# Patient Record
Sex: Female | Born: 1965 | Race: Black or African American | Hispanic: No
Health system: Southern US, Community
[De-identification: ages and names within clinical notes are randomized; demographics above are authoritative.]

## PROBLEM LIST (undated history)

## (undated) DIAGNOSIS — E119 Type 2 diabetes mellitus without complications: Secondary | ICD-10-CM

## (undated) DIAGNOSIS — D219 Benign neoplasm of connective and other soft tissue, unspecified: Secondary | ICD-10-CM

## (undated) DIAGNOSIS — D649 Anemia, unspecified: Secondary | ICD-10-CM

## (undated) HISTORY — PX: TUBAL LIGATION: SHX77

---

## 2006-02-03 ENCOUNTER — Emergency Department (HOSPITAL_COMMUNITY): Admission: EM | Admit: 2006-02-03 | Discharge: 2006-02-03 | Payer: Self-pay | Admitting: Emergency Medicine

## 2006-04-10 ENCOUNTER — Ambulatory Visit: Payer: Self-pay | Admitting: Internal Medicine

## 2006-04-10 DIAGNOSIS — E119 Type 2 diabetes mellitus without complications: Secondary | ICD-10-CM

## 2006-04-10 DIAGNOSIS — E785 Hyperlipidemia, unspecified: Secondary | ICD-10-CM | POA: Insufficient documentation

## 2006-04-10 LAB — CONVERTED CEMR LAB: LDL Cholesterol: 105 mg/dL

## 2006-05-13 ENCOUNTER — Encounter (INDEPENDENT_AMBULATORY_CARE_PROVIDER_SITE_OTHER): Payer: Self-pay | Admitting: Internal Medicine

## 2006-05-13 ENCOUNTER — Ambulatory Visit: Payer: Self-pay | Admitting: Internal Medicine

## 2006-05-28 ENCOUNTER — Ambulatory Visit: Payer: Self-pay | Admitting: *Deleted

## 2006-08-14 ENCOUNTER — Ambulatory Visit: Payer: Self-pay | Admitting: Internal Medicine

## 2006-08-14 LAB — CONVERTED CEMR LAB: Cholesterol: 143 mg/dL

## 2006-10-14 ENCOUNTER — Ambulatory Visit: Payer: Self-pay | Admitting: Internal Medicine

## 2006-11-18 ENCOUNTER — Ambulatory Visit: Payer: Self-pay | Admitting: Internal Medicine

## 2007-02-09 ENCOUNTER — Telehealth (INDEPENDENT_AMBULATORY_CARE_PROVIDER_SITE_OTHER): Payer: Self-pay | Admitting: Internal Medicine

## 2007-02-10 ENCOUNTER — Encounter (INDEPENDENT_AMBULATORY_CARE_PROVIDER_SITE_OTHER): Payer: Self-pay | Admitting: Internal Medicine

## 2007-02-11 ENCOUNTER — Telehealth (INDEPENDENT_AMBULATORY_CARE_PROVIDER_SITE_OTHER): Payer: Self-pay | Admitting: Internal Medicine

## 2007-02-15 DIAGNOSIS — N920 Excessive and frequent menstruation with regular cycle: Secondary | ICD-10-CM

## 2007-02-15 DIAGNOSIS — G5600 Carpal tunnel syndrome, unspecified upper limb: Secondary | ICD-10-CM

## 2007-02-15 DIAGNOSIS — D259 Leiomyoma of uterus, unspecified: Secondary | ICD-10-CM | POA: Insufficient documentation

## 2007-02-15 DIAGNOSIS — R609 Edema, unspecified: Secondary | ICD-10-CM

## 2007-02-18 ENCOUNTER — Encounter (INDEPENDENT_AMBULATORY_CARE_PROVIDER_SITE_OTHER): Payer: Self-pay | Admitting: *Deleted

## 2007-02-19 ENCOUNTER — Ambulatory Visit: Payer: Self-pay | Admitting: Internal Medicine

## 2007-02-19 DIAGNOSIS — N946 Dysmenorrhea, unspecified: Secondary | ICD-10-CM

## 2007-02-23 ENCOUNTER — Encounter (INDEPENDENT_AMBULATORY_CARE_PROVIDER_SITE_OTHER): Payer: Self-pay | Admitting: Nurse Practitioner

## 2007-08-25 ENCOUNTER — Ambulatory Visit: Payer: Self-pay | Admitting: Internal Medicine

## 2007-08-25 DIAGNOSIS — R05 Cough: Secondary | ICD-10-CM

## 2007-08-25 LAB — CONVERTED CEMR LAB: Hgb A1c MFr Bld: 5.8 %

## 2007-08-26 ENCOUNTER — Telehealth (INDEPENDENT_AMBULATORY_CARE_PROVIDER_SITE_OTHER): Payer: Self-pay | Admitting: Internal Medicine

## 2007-08-26 DIAGNOSIS — K219 Gastro-esophageal reflux disease without esophagitis: Secondary | ICD-10-CM

## 2008-02-10 ENCOUNTER — Telehealth (INDEPENDENT_AMBULATORY_CARE_PROVIDER_SITE_OTHER): Payer: Self-pay | Admitting: Internal Medicine

## 2008-03-08 ENCOUNTER — Encounter (INDEPENDENT_AMBULATORY_CARE_PROVIDER_SITE_OTHER): Payer: Self-pay | Admitting: Internal Medicine

## 2008-03-08 ENCOUNTER — Ambulatory Visit: Payer: Self-pay | Admitting: Internal Medicine

## 2008-03-08 DIAGNOSIS — R82998 Other abnormal findings in urine: Secondary | ICD-10-CM | POA: Insufficient documentation

## 2008-03-08 LAB — CONVERTED CEMR LAB
Albumin: 4.1 g/dL (ref 3.5–5.2)
BUN: 6 mg/dL (ref 6–23)
Basophils Absolute: 0 10*3/uL (ref 0.0–0.1)
Basophils Relative: 0 % (ref 0–1)
Bilirubin Urine: NEGATIVE
Blood in Urine, dipstick: NEGATIVE
CO2: 22 meq/L (ref 19–32)
Calcium: 8.7 mg/dL (ref 8.4–10.5)
Chlamydia, DNA Probe: NEGATIVE
Chloride: 105 meq/L (ref 96–112)
GC Probe Amp, Genital: NEGATIVE
Glucose, Bld: 83 mg/dL (ref 70–99)
Hemoglobin: 12 g/dL (ref 12.0–15.0)
Hgb A1c MFr Bld: 6.1 %
KOH Prep: NEGATIVE
Ketones, urine, test strip: NEGATIVE
Lymphocytes Relative: 39 % (ref 12–46)
Lymphs Abs: 2.9 10*3/uL (ref 0.7–4.0)
MCV: 89.6 fL (ref 78.0–100.0)
Monocytes Absolute: 0.6 10*3/uL (ref 0.1–1.0)
Monocytes Relative: 8 % (ref 3–12)
Nitrite: POSITIVE
Platelets: 277 10*3/uL (ref 150–400)
RBC: 4.15 M/uL (ref 3.87–5.11)
RDW: 15.3 % (ref 11.5–15.5)
Specific Gravity, Urine: 1.015
VLDL: 12 mg/dL (ref 0–40)
WBC: 7.3 10*3/uL (ref 4.0–10.5)
Whiff Test: POSITIVE

## 2008-03-09 ENCOUNTER — Encounter (INDEPENDENT_AMBULATORY_CARE_PROVIDER_SITE_OTHER): Payer: Self-pay | Admitting: Internal Medicine

## 2008-03-15 ENCOUNTER — Ambulatory Visit (HOSPITAL_COMMUNITY): Admission: RE | Admit: 2008-03-15 | Discharge: 2008-03-15 | Payer: Self-pay | Admitting: Internal Medicine

## 2008-03-15 ENCOUNTER — Encounter (INDEPENDENT_AMBULATORY_CARE_PROVIDER_SITE_OTHER): Payer: Self-pay | Admitting: Internal Medicine

## 2008-03-28 ENCOUNTER — Encounter: Admission: RE | Admit: 2008-03-28 | Discharge: 2008-03-28 | Payer: Self-pay | Admitting: Internal Medicine

## 2008-03-28 ENCOUNTER — Encounter (INDEPENDENT_AMBULATORY_CARE_PROVIDER_SITE_OTHER): Payer: Self-pay | Admitting: Internal Medicine

## 2008-05-11 ENCOUNTER — Ambulatory Visit: Payer: Self-pay | Admitting: Obstetrics and Gynecology

## 2008-05-12 ENCOUNTER — Encounter: Payer: Self-pay | Admitting: Family

## 2008-05-12 LAB — CONVERTED CEMR LAB
Hemoglobin: 12.2 g/dL (ref 12.0–15.0)
MCHC: 31.8 g/dL (ref 30.0–36.0)
Platelets: 331 10*3/uL (ref 150–400)
RDW: 14.8 % (ref 11.5–15.5)
TSH: 2.242 microintl units/mL (ref 0.350–4.50)

## 2008-05-19 ENCOUNTER — Ambulatory Visit (HOSPITAL_COMMUNITY): Admission: RE | Admit: 2008-05-19 | Discharge: 2008-05-19 | Payer: Self-pay | Admitting: Obstetrics & Gynecology

## 2008-06-01 ENCOUNTER — Ambulatory Visit (HOSPITAL_COMMUNITY): Admission: RE | Admit: 2008-06-01 | Discharge: 2008-06-01 | Payer: Self-pay | Admitting: Obstetrics and Gynecology

## 2008-09-21 ENCOUNTER — Encounter (INDEPENDENT_AMBULATORY_CARE_PROVIDER_SITE_OTHER): Payer: Self-pay | Admitting: Internal Medicine

## 2008-09-22 ENCOUNTER — Telehealth (INDEPENDENT_AMBULATORY_CARE_PROVIDER_SITE_OTHER): Payer: Self-pay | Admitting: *Deleted

## 2009-01-04 ENCOUNTER — Telehealth (INDEPENDENT_AMBULATORY_CARE_PROVIDER_SITE_OTHER): Payer: Self-pay | Admitting: Internal Medicine

## 2009-10-02 ENCOUNTER — Encounter (INDEPENDENT_AMBULATORY_CARE_PROVIDER_SITE_OTHER): Payer: Self-pay | Admitting: Internal Medicine

## 2010-03-12 ENCOUNTER — Encounter: Payer: Self-pay | Admitting: Physician Assistant

## 2010-03-12 ENCOUNTER — Ambulatory Visit: Payer: Self-pay | Admitting: Internal Medicine

## 2010-03-12 DIAGNOSIS — J069 Acute upper respiratory infection, unspecified: Secondary | ICD-10-CM | POA: Insufficient documentation

## 2010-03-12 LAB — CONVERTED CEMR LAB
BUN: 8 mg/dL (ref 6–23)
Blood Glucose, Fingerstick: 175
CO2: 25 meq/L (ref 19–32)
Chloride: 106 meq/L (ref 96–112)
Glucose, Bld: 151 mg/dL — ABNORMAL HIGH (ref 70–99)
Hgb A1c MFr Bld: 6 % — ABNORMAL HIGH (ref <5.7)
Sodium: 138 meq/L (ref 135–145)

## 2010-03-30 ENCOUNTER — Telehealth (INDEPENDENT_AMBULATORY_CARE_PROVIDER_SITE_OTHER): Payer: Self-pay | Admitting: Internal Medicine

## 2010-04-07 ENCOUNTER — Encounter (INDEPENDENT_AMBULATORY_CARE_PROVIDER_SITE_OTHER): Payer: Self-pay | Admitting: Internal Medicine

## 2010-05-31 ENCOUNTER — Ambulatory Visit: Payer: Self-pay | Admitting: Internal Medicine

## 2010-07-03 NOTE — Assessment & Plan Note (Signed)
Summary: URI; DM   Vital Signs:  Patient profile:   45 year old female Height:      69 inches Weight:      252 pounds BMI:     37.35 Temp:     97.8 degrees F oral Pulse rate:   76 / minute Pulse rhythm:   regular Resp:     18 per minute BP sitting:   128 / 82  (left arm) Cuff size:   large  Vitals Entered By: [place] Shannon (March 12, 2010 10:17 AM) CC: pt says she has been sick since  [place].. pt says she needs a note for work since she missed Sat and Sunday and she don't think she can make it today... Is Patient Diabetic? Yes Pain Assessment Patient in pain? no      CBG Result 175  Does patient need assistance? Functional Status Self care Ambulation Normal   Primary Care Provider:  Julieanne Manson MD  CC:  pt says she has been sick since  Wed.. pt says she needs a note for work since she missed Sat and Sunday and she don't think she can make it today....  History of Present Illness: Here for URI. Not seen since 03/2008.  Out of meds for DM for 6 mos or so.  She lost eligiblity.  FInally got orange card back.  Had a hard time making appt . . they thought she was a new patient.  DM2:  Since out of meds, she gets "shakes" when sugar is too low.  Feels "drunk" when sugar too high.  Does not check sugars at home.  Did not eat today.  Notes polyuria.  Notes some blurry vision.  URI:  Notes symptoms for 6 days.  + cough . . . thick yellow/white; notes a lot of sputum.  + headache . Marland Kitchen .points to frontal and parietal areas bilat and post neck (feels like pressure).  + scratchy throat.  No otalgia.  Chest hurts when she coughs.  No dyspnea.  + nasal congestion.  + fever . . . did not checks.  + chills last week.  Feels better today.  States today is the best she has felt since it began.  Needs doctor's note for work.  Works 2 jobs.  Current Medications (verified): 1)  Byetta 5 Mcg Pen 5 Mcg/0.56ml  Soln (Exenatide) .... 5 Micrograms Subcutaneously Two Times A Day With Meals 2)   Metformin Hcl 500 Mg  Tabs (Metformin Hcl) .Marland Kitchen.. 1 Tab Po Two Times A Day With Meals 3)  Furosemide 20 Mg  Tabs (Furosemide) .... 1/2 To 1 Tab By Mouth Q Am 4)  Pen Needles 31g X 8 Mm  Misc (Insulin Pen Needle) .... Use Twice Daily With Byetta 5)  Glucometer Elite Classic   Kit (Blood Glucose Monitoring Suppl) .... Once Daily Testing 6)  Glucometer Elite Test   Strp (Glucose Blood) .... Once Daily Testing 7)  Nexium 40 Mg  Cpdr (Esomeprazole Magnesium) .Marland Kitchen.. 1 Tablet By Mouth Daily For Stomach  Allergies (verified): No Known Drug Allergies  Social History: Separated Lives at home with 73 yo son, 76 yo son, 59 yo son, 30 yo son, 8 yo daughter and 21 yo nephew(son of sister who died)--[place]. 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  Physical Exam  General:  alert, well-developed, and well-nourished.   Head:  normocephalic and atraumatic.   Eyes:  pupils equal,  pupils round, and pupils reactive to light.   Ears:  R ear normal and L ear normal.   Nose:  no nasal discharge.   Mouth:  pharynx pink and moist and no exudates.   Neck:  supple and no cervical lymphadenopathy.   Lungs:  good air movement exp wheezes at bases bilat no rales  Heart:  normal rate and regular rhythm.   Neurologic:  alert & oriented X3 and cranial nerves II-XII intact.   Skin:  turgor normal.   Psych:  normally interactive.     Impression & Recommendations:  Problem # 1:  UPPER RESPIRATORY INFECTION (ICD-465.9)  prob viral has dev some bronchitis likely 2/2 smoking symptomatic tx for now if no improvement or getting worse, may need antibx therapy note given will give tussinex to use at bedtime to help with cough mucinex otc tessalon perles proventil as needed fluids, rest, tylenol  Her updated medication list for this problem includes:    Tessalon Perles 100 Mg Caps (Benzonatate) .Marland Kitchen... Take 1 capsule by mouth three  times a day as needed for cough    Tussionex Pennkinetic Er 10-8 Mg/68ml Lqcr (Hydrocod polst-chlorphen polst) .Marland Kitchen... 1 tsp at bedtime as needed for cough  Problem # 2:  DEPENDENT EDEMA, LEGS (ICD-782.3)  needs furosemide refilled  Her updated medication list for this problem includes:    Furosemide 20 Mg Tabs (Furosemide) .Marland Kitchen... 1/2 to 1 tab by mouth q am  Orders: T-Basic Metabolic Panel [id number])  Problem # 3:  DIABETES MELLITUS, TYPE II, CONTROLLED (ICD-250.00)  check lab refill meds f/u with Dr. Delrae Alfred  Her updated medication list for this problem includes:    Byetta 5 Mcg Pen 5 Mcg/0.51ml Soln (Exenatide) .Marland KitchenMarland KitchenMarland KitchenMarland Kitchen 5 micrograms subcutaneously two times a day with meals    Metformin Hcl 500 Mg Tabs (Metformin hcl) .Marland Kitchen... 1 tab po two times a day with meals  Orders: Capillary Blood Glucose/CBG (82948) T- Hemoglobin A1C (64332-95188) T-Basic Metabolic Panel ([address]60-63016)  Complete Medication List: 1)  Byetta 5 Mcg Pen 5 Mcg/0.28ml Soln (Exenatide) .... 5 micrograms subcutaneously two times a day with meals 2)  Metformin Hcl 500 Mg Tabs (Metformin hcl) .Marland Kitchen.. 1 tab po two times a day with meals 3)  Furosemide 20 Mg Tabs (Furosemide) .... 1/2 to 1 tab by mouth q am 4)  Pen Needles 31g X 8 Mm Misc (Insulin pen needle) .... Use twice daily with byetta 5)  Glucometer Elite Classic Kit (Blood glucose monitoring suppl) .... Once daily testing 6)  Glucometer Elite Test Strp (Glucose blood) .... Once daily testing 7)  Tessalon Perles 100 Mg Caps (Benzonatate) .... Take 1 capsule by mouth three times a day as needed for cough 8)  Proventil Hfa 108 (90 Base) Mcg/act Aers (Albuterol sulfate) .Marland Kitchen.. 1-2 puffs every 4-6 hours as needed 9)  Tussionex Pennkinetic Er 10-8 Mg/22ml Lqcr (Hydrocod polst-chlorphen polst) .Marland Kitchen.. 1 tsp at bedtime as needed for cough  Patient Instructions: 1)  Get Mucinex DM over the counter and take 600 mg by mouth two times a day for 10 days. 2)  Use Tessalon Perles as  needed for cough. 3)  Use the Tussinex at bedtime as needed for cough.  It has hydrocodone in it and will make you drowsy. 4)  Use the Proventil every 4-6 hours around the clock for 2-3 days.  Then, use every 4-6 hours as needed. 5)  Drink plenty of fluids.  Get plenty of rest. 6)  Take tylenol as needed for pain or  headaches or fever. 7)  Schedule follow up if no better or feeling worse. 8)  I have faxed your prescriptions to the [facility]. pharmacy. 9)  Schedule a CPP with Dr. Delrae Alfred in 4 weeks. Prescriptions: TUSSIONEX PENNKINETIC ER 10-8 MG/5ML LQCR (HYDROCOD POLST-CHLORPHEN POLST) 1 tsp at bedtime as needed for cough  #60 mL x 0   Entered and Authorized by:   Tereso Newcomer PA-C   Signed by:   Tereso Newcomer PA-C on 03/12/2010   Method used:   Print then Give to Patient   RxID:   [id number] GLUCOMETER ELITE TEST   STRP (GLUCOSE BLOOD) once daily testing  #100 x 4   Entered and Authorized by:   Tereso Newcomer PA-C   Signed by:   Tereso Newcomer PA-C on 03/12/2010   Method used:   Faxed to ...       [facility] - [facility] (retail)       [address] [place], [place]  [address]       Ph: [phone]       Fax: [phone]   RxID:   [id number] PEN NEEDLES 31G X 8 MM  MISC (INSULIN PEN NEEDLE) use twice daily with byetta  #60 x 3   Entered and Authorized by:   Tereso Newcomer PA-C   Signed by:   Tereso Newcomer PA-C on 03/12/2010   Method used:   Faxed to ...       [facility] - Pharmac (retail)       [address] [place], [place]  [address]       Ph: [phone]       Fax: [phone]   RxID:   [id number] FUROSEMIDE 20 MG  TABS (FUROSEMIDE) 1/2 to 1 tab by mouth q am  #30 x 3   Entered and Authorized by:   Tereso Newcomer PA-C   Signed by:   Tereso Newcomer PA-C on 03/12/2010   Method used:   Faxed to ...       [facility] - Pharmac (retail)       [address] [place], [place]  [address]       Ph: [phone]       Fax: [phone]   RxID:   [id number] METFORMIN HCL 500 MG  TABS (METFORMIN HCL) 1 tab po two times a day with meals  #60 x 3   Entered and Authorized by:   Tereso Newcomer PA-C   Signed by:   Tereso Newcomer PA-C on 03/12/2010   Method used:   Faxed to ...       [facility] - Pharmac (retail)       [address] [place], [place]  [address]       Ph: [phone]       Fax: [phone]   RxID:   [id number] BYETTA 5 MCG PEN 5 MCG/0.02ML  SOLN (EXENATIDE) 5 micrograms subcutaneously two times a day with meals  #1 mo supply x 3   Entered and Authorized by:   Tereso Newcomer PA-C   Signed by:   Tereso Newcomer PA-C on 03/12/2010   Method used:   Faxed to ...       [facility] - Pharmac (retail)       [address]  [place], [place]  [address]       Ph: [phone]       Fax: [phone]   RxID:   [id number] PROVENTIL HFA 108 (90 BASE) MCG/ACT AERS (ALBUTEROL SULFATE) 1-2 puffs every 4-6 hours as needed  #1 x 0   Entered and Authorized by:   Tereso Newcomer PA-C   Signed by:   Tereso Newcomer PA-C on 03/12/2010   Method used:   Faxed to ...       [facility] - Pharmac (retail)       [address] [place], [place]  [address]       Ph: [phone]       Fax: [phone]   RxID:   [id number] TESSALON PERLES 100 MG CAPS (BENZONATATE) Take 1 capsule by mouth three times a day as needed for cough  #30 x 0   Entered and Authorized by:   Tereso Newcomer PA-C   Signed by:   Tereso Newcomer PA-C on 03/12/2010   Method used:   Faxed to ...       [facility] - Pharmac (retail)       [address] [place], [place]  [address]       Ph: [phone]       Fax: [phone]   RxID:   [id number]

## 2010-07-03 NOTE — Progress Notes (Signed)
Summary: NEEDS SAMPLES OF BEYETTA  Phone Note Call from Patient Call back at [facility] Phone [phone]   Summary of Call: Alexandra Patrick PT. MS HOPKISN CALLED BECAUSE [facility] TOLD HER TO SEE IF WE HAD SAMPLES OF THE BEYETTA PEN, BECAUSE ITS GONNA TAKE UP TO 7 WEEKS FOR HER PEN TO COME FROM THE PHARMACUTICAL COMP.  Initial call taken by: Leodis Rains,  March 28, 2010 3:00PM  Follow-up for Phone Call        I SPOKE W/TERESA, AND SHE SAYS WE HAVE 2 SAMPLES AND SHE SAYS THAT SHE WOULD EITHER COME BY ON TOMORROW 10/27 OR ON 10/28 TO PICK THEM UP. Follow-up by: Leodis Rains,  March 30, 2010 2:07 PM  Additional Follow-up for Phone Call Additional follow up Details #1::        2 samples of Byetta 5 micrograms pens given to pt. Dutch Quint RN  March 30, 2010 2:13 PM

## 2010-07-03 NOTE — Letter (Signed)
Summary: [facility]//faxed  [facility]//faxed   Imported By: Arta Bruce 10/02/2009 12:01:47  _____________________________________________________________________  External Attachment:    Type:   Image     Comment:   External Document

## 2010-07-03 NOTE — Progress Notes (Signed)
Summary: NEEDS MEDS REFILLS  Phone Note Call from Patient   Caller: Patient Summary of Call: Alexandra Patrick PT. NEEDS A REFILL ON HER BEYETA MEDICAITON AND HASN'T HAD IT SINCE LAST WEDNEDSAY, AND NEEDS IT CALLED INTO [facility] Initial call taken by: Leodis Rains,  February 11, 2007 10:20 AM  Follow-up for Phone Call        Phone Call Completed Follow-up by: Vesta Mixer CMA,  February 11, 2007 12:13 PM

## 2010-07-03 NOTE — Letter (Signed)
Summary: Out of Work  [facility]  [address] [place], [place] [address]   Phone: [phone]  Fax: [phone]    March 12, 2010   Employee:  TATYANNA CRONK    To Whom It May Concern:   For Medical reasons, please excuse the above named employee from work for the following dates:  Start:   Saturday, March 10, 2009  End:   Tuesday, March 13, 2009  If you need additional information, please feel free to contact our office.         Sincerely,    Tereso Newcomer PA-C

## 2010-07-03 NOTE — Letter (Signed)
Summary: *HSN Results Follow up  [facility]  [address] [place], [place] [address]   Phone: [phone]  Fax: [phone]      04/07/2010   Alexandra Patrick [address] [place], [place]  [address]   Dear  [name],                            ____S.Drinkard,FNP   ____D. Gore,FNP       ____B. McPherson,MD   ____V. Rankins,MD    __X__[name],MD    ____N. Daphine Deutscher, FNP  ____D. Reche Dixon, MD    ____K. Philipp Deputy, MD    ____Other     This letter is to inform you that your recent test(s):  _______Pap Smear    ____X_Lab Test     _______X-ray    ___X___ is within acceptable limits  _______ requires a medication change  _______ requires a follow-up lab visit  _______ requires a follow-up visit with your provider   Comments:  Your diabetic control is very good.  If you feel you are having low blood sugars, please actually check you blood sugar when you have those symptoms.  Make sure you do not skip meals as well.       _________________________________________________________ If you have any questions, please contact our office                     Sincerely,  Julieanne Manson MD [facility]

## 2010-07-24 ENCOUNTER — Telehealth (INDEPENDENT_AMBULATORY_CARE_PROVIDER_SITE_OTHER): Payer: Self-pay | Admitting: Internal Medicine

## 2010-07-31 NOTE — Progress Notes (Signed)
Summary: Alexandra Patrick   Phone Note Call from Patient   Refills Requested: Medication #1:  BYETTA 5 MCG PEN 5 MCG/0.02ML  SOLN 5 micrograms subcutaneously two times a day with meals Summary of Call: Ventolin inhalar PT WANTS HER RX [facility] [address]  . PT IS GOING OUT OF TOWN  FRIDAY AM PT NEED IT BY THURSDAY AM  Wynelle Link, CALL HER @ [phone] [name] YOU  Initial call taken by: Cheryll Dessert,  July 24, 2010 2:59 PM  Follow-up for Phone Call        Does not need byetta.  Ever since she had bronchitis in October, has episodes of coughing and SOB.  Ventolin was for tx of URI and one fill only.  Would like refill to hold her till next appt.  Episodes occur when she's overexerting or a different scent/odor can set it off.  Still smoking.  Is going to [place] and is afraid change of environment will start an episode of SOB and doesn't want to be without an inhaler, since she has been using it as needed since October.  Has appt. 08/07/10 to see provider.    Follow-up by: Dutch Quint RN,  July 24, 2010 5:34 PM  Additional Follow-up for Phone Call Additional follow up Details #1::        Okay to fill once--but she should be more concerned with her smoking rather than whatever else is in her environment (unless it is more cigarette smoke) Additional Follow-up by: Julieanne Manson MD,  July 25, 2010 12:37 PM    Additional Follow-up for Phone Call Additional follow up Details #2::    Pt. notified of completed refill and provider's response re smoking.  Verbalized understanding and agreement.  Dutch Quint RN  July 27, 2010 6:06 PM   New/Updated Medications: VENTOLIN HFA 108 (90 BASE) MCG/ACT AERS (ALBUTEROL SULFATE) 1-2 puffs every 4-6 hours as needed Prescriptions: VENTOLIN HFA 108 (90 BASE) MCG/ACT AERS (ALBUTEROL SULFATE) 1-2 puffs every 4-6 hours as needed  #1 x 0   Entered by:   Dutch Quint RN   Authorized by:   Julieanne Manson MD   Signed by:   Dutch Quint RN on  07/27/2010   Method used:   Electronically to        [facility] [phone]* (retail)       [address]       [place], [place]  [address]       Ph: [phone]       Fax: [phone]   RxID:   [id number]

## 2010-10-16 NOTE — Group Therapy Note (Signed)
NAMECOLINE, Alexandra Patrick               ACCOUNT NO.:  [id number]   MEDICAL RECORD NO.:  [id number]          PATIENT TYPE:  WOC   LOCATION:  [facility]                   FACILITY:  [facility]   PHYSICIAN:  Sid Falcon, CNM  DATE OF BIRTH:  1965-07-25   DATE OF SERVICE:  05/11/2008                                  CLINIC NOTE   The patient is here at the referral from health service due to  menorrhagia.   MENSTRUAL HISTORY:  First day of last menstrual period was on May 05, 2008, lasted approximately 5 days.  Menarche was at 45 years of age,  reports regular cycles every 21-23 days, all lasting approximately 5  days with increased heavy flow the patient reports on day 1 and 2 of  cycle, and had been using super plus tampon, that she does leak and  needs to wear a back-up pad and has to change it every 1-1/2 to 2 hours.  Reports moderate pain with menses and no bleeding in between.   CONTRACEPTIVE HISTORY:  Bilateral tubal ligation in 1999.   OBSTETRICAL HISTORY:  History of 8 pregnancies with 5 living children  and 3 miscarriages, last pregnancy was 10-1/2  years ago.   GYNECOLOGIC HISTORY:  Last Pap smear was in September 2009, had an  abnormal Pap in 1999, had a colposcopy with negative results.  Last  mammogram was in October 2009 and normal.   SURGICAL HISTORY:  Bilateral tubal ligation.   FAMILY HISTORY:  Diabetes.  Parents:  Heart disease mother, heart attack  father.  High blood pressure mother and father.   PERSONAL MEDICAL HISTORY:  Type 2 diabetes known, diagnosed 2 years ago.  Currently taking metformin and Byetta.  The patient also has  hypertension for which she is taking Lasix.   SOCIAL HISTORY:  The patient lives with herself and children, does work  outside the home.  Smokes approximately 1 pack over 3 days for the past  20 years.  Drinks approximately 1-2 alcoholic drinks per week.  No  history of IV drug use, no history of sexual or physical abuse, and no  reports of abuse at this time.   REVIEW OF SYSTEMS:  The patient reports multiple symptoms for which she  is receiving followup with her primary care doctor, primarily numbness  and weakness in fingers, swelling in legs, fatigue, weight gain, dizzy  spells, problems with vision and hot flashes.   EXAM:  The patient is alert and oriented x3.  No signs of acute  distress.  Skin color appropriate for race  VITAL SIGNS:  Temperature 98, pulse 84, blood pressure 130/84, weight  approximately 250 pounds.  CARDIOVASCULAR SYSTEM:  Regular rate and rhythm without murmurs, gallops  or rubs.  LUNGS:  Clear to auscultation bilaterally.  ABDOMEN:  Soft, nontender with palpation.  PELVIC EXAM:  No bleeding seen in the introitus.  Uterus approximately [redacted]  weeks gestation size.  No dominant masses, nontender with palpation on  the right but lower quadrant pain on the left side.   ASSESSMENT:  Menorrhagia.   PLAN:  Pelvic ultrasound.  Labs:  CBC, TSH,  free T3 free T4.  The  patient will follow up in approximately 3 weeks for results.      Sid Falcon, CNM     WM/MEDQ  D:  05/11/2008  T:  05/12/2008  Job:  [id number]

## 2011-08-14 ENCOUNTER — Other Ambulatory Visit (HOSPITAL_COMMUNITY): Payer: Self-pay | Admitting: Internal Medicine

## 2011-08-14 DIAGNOSIS — R200 Anesthesia of skin: Secondary | ICD-10-CM

## 2011-08-14 DIAGNOSIS — M79601 Pain in right arm: Secondary | ICD-10-CM

## 2011-08-16 ENCOUNTER — Ambulatory Visit (HOSPITAL_COMMUNITY)
Admission: RE | Admit: 2011-08-16 | Discharge: 2011-08-16 | Disposition: A | Discharge status: Home / Self Care | Payer: Self-pay | Source: Ambulatory Visit | Attending: Internal Medicine | Admitting: Internal Medicine

## 2011-08-16 DIAGNOSIS — R200 Anesthesia of skin: Secondary | ICD-10-CM

## 2011-08-16 DIAGNOSIS — M79609 Pain in unspecified limb: Secondary | ICD-10-CM | POA: Insufficient documentation

## 2011-08-16 DIAGNOSIS — M25519 Pain in unspecified shoulder: Secondary | ICD-10-CM | POA: Insufficient documentation

## 2011-08-16 DIAGNOSIS — M5090 Cervical disc disorder, unspecified, unspecified cervical region: Secondary | ICD-10-CM | POA: Insufficient documentation

## 2011-08-16 DIAGNOSIS — M79601 Pain in right arm: Secondary | ICD-10-CM

## 2011-09-04 ENCOUNTER — Ambulatory Visit: Payer: Self-pay | Attending: Internal Medicine | Admitting: Physical Therapy

## 2011-09-04 DIAGNOSIS — IMO0001 Reserved for inherently not codable concepts without codable children: Secondary | ICD-10-CM | POA: Insufficient documentation

## 2011-09-04 DIAGNOSIS — M6281 Muscle weakness (generalized): Secondary | ICD-10-CM | POA: Insufficient documentation

## 2011-09-10 ENCOUNTER — Ambulatory Visit: Payer: Self-pay | Admitting: *Deleted

## 2011-09-12 ENCOUNTER — Ambulatory Visit: Payer: Self-pay | Admitting: Physical Therapy

## 2011-09-12 ENCOUNTER — Ambulatory Visit: Payer: Self-pay | Admitting: *Deleted

## 2011-09-17 ENCOUNTER — Ambulatory Visit: Payer: Self-pay | Admitting: *Deleted

## 2011-09-19 ENCOUNTER — Ambulatory Visit: Payer: Self-pay | Admitting: *Deleted

## 2011-09-24 ENCOUNTER — Ambulatory Visit: Payer: Self-pay | Admitting: Physical Therapy

## 2011-09-26 ENCOUNTER — Ambulatory Visit: Payer: Self-pay | Admitting: Physical Therapy

## 2011-10-01 ENCOUNTER — Ambulatory Visit: Payer: Self-pay | Admitting: Physical Therapy

## 2011-10-03 ENCOUNTER — Ambulatory Visit: Payer: Self-pay | Admitting: Physical Therapy

## 2012-04-26 ENCOUNTER — Inpatient Hospital Stay (HOSPITAL_COMMUNITY): Payer: Medicaid Other

## 2012-04-26 ENCOUNTER — Inpatient Hospital Stay (HOSPITAL_COMMUNITY)
Admission: AD | Admit: 2012-04-26 | Discharge: 2012-04-26 | Disposition: A | Discharge status: Home / Self Care | Payer: Medicaid Other | Source: Ambulatory Visit | Attending: Obstetrics & Gynecology | Admitting: Obstetrics & Gynecology

## 2012-04-26 ENCOUNTER — Encounter (HOSPITAL_COMMUNITY): Payer: Self-pay | Admitting: *Deleted

## 2012-04-26 DIAGNOSIS — R102 Pelvic and perineal pain: Secondary | ICD-10-CM

## 2012-04-26 DIAGNOSIS — N83209 Unspecified ovarian cyst, unspecified side: Secondary | ICD-10-CM | POA: Insufficient documentation

## 2012-04-26 DIAGNOSIS — A499 Bacterial infection, unspecified: Secondary | ICD-10-CM | POA: Insufficient documentation

## 2012-04-26 DIAGNOSIS — N8000 Endometriosis of the uterus, unspecified: Secondary | ICD-10-CM | POA: Insufficient documentation

## 2012-04-26 DIAGNOSIS — B9689 Other specified bacterial agents as the cause of diseases classified elsewhere: Secondary | ICD-10-CM | POA: Insufficient documentation

## 2012-04-26 DIAGNOSIS — R109 Unspecified abdominal pain: Secondary | ICD-10-CM | POA: Insufficient documentation

## 2012-04-26 DIAGNOSIS — N760 Acute vaginitis: Secondary | ICD-10-CM | POA: Insufficient documentation

## 2012-04-26 HISTORY — DX: Type 2 diabetes mellitus without complications: E11.9

## 2012-04-26 HISTORY — DX: Anemia, unspecified: D64.9

## 2012-04-26 HISTORY — DX: Benign neoplasm of connective and other soft tissue, unspecified: D21.9

## 2012-04-26 LAB — URINALYSIS, ROUTINE W REFLEX MICROSCOPIC
Glucose, UA: NEGATIVE mg/dL
Leukocytes, UA: NEGATIVE
Nitrite: NEGATIVE
Protein, ur: NEGATIVE mg/dL

## 2012-04-26 LAB — WET PREP, GENITAL
Trich, Wet Prep: NONE SEEN
Yeast Wet Prep HPF POC: NONE SEEN

## 2012-04-26 LAB — CBC
HCT: 33.2 % — ABNORMAL LOW (ref 36.0–46.0)
MCV: 87.1 fL (ref 78.0–100.0)
Platelets: 303 10*3/uL (ref 150–400)
RBC: 3.81 MIL/uL — ABNORMAL LOW (ref 3.87–5.11)
WBC: 13.9 10*3/uL — ABNORMAL HIGH (ref 4.0–10.5)

## 2012-04-26 MED ORDER — METRONIDAZOLE 500 MG PO TABS: 500.0000 mg | ORAL_TABLET | Freq: Two times a day (BID) | ORAL | Status: AC

## 2012-04-26 MED ORDER — KETOROLAC TROMETHAMINE 30 MG/ML IJ SOLN
60.0000 mg | Freq: Four times a day (QID) | INTRAMUSCULAR | Status: DC | PRN
  Administered 2012-04-26: 60 mg via INTRAMUSCULAR
  Filled 2012-04-26 (×2): qty 1

## 2012-04-26 MED ORDER — IBUPROFEN 600 MG PO TABS: 600.0000 mg | ORAL_TABLET | Freq: Four times a day (QID) | ORAL | Status: DC | PRN

## 2012-04-26 NOTE — MAU Provider Note (Signed)
CC: Pelvic Pain    First Provider Initiated Contact with Patient 04/26/12 1753      HPI Alexandra Patrick is a 46 y.o. [id number] who presents with onset Today of Sharp shooting suprapubic abdominal pain that is similar to episodes she's had in the past but is more severe. It's worse when she sits down. She feels intravaginal and rectal pressure as well. She states she had fibroids that were diagnosed in [place]. LMP 04/10/12. Menses are usually irregular being monthly for 2-3 months and often goes 5-7 weeks. It has been 7 weeks since her PMP. She has had metrorrhagia in the past and is on vitamin supplement with iron. Denies intermenstrual bleeding. Has an appointment with a gynecologist in [facility] on 05/06/2012. She has had tubal ligation and uses condoms during intercourse. Had a normal Pap at her primary physician's office in August. She denies dysuria as she's had in the past with other UTIs, but she feels sharp abdominal pain with urination. Denies hematuria, frequency, urgency.  Past Medical History  Diagnosis Date  . Fibroid   . Diabetes mellitus without complication   . Anemia     OB History    Grav Para Term Preterm Abortions TAB SAB Ect Mult Living   7 5 5  0 2 0 2 0 0 5     # Outc Date GA Lbr Len/2nd Wgt Sex Del Anes PTL Lv   1 TRM            2 TRM            3 TRM            4 TRM            5 TRM            6 SAB            7 SAB               No past surgical history on file.  History   Social History  . Marital Status: Legally Separated    Spouse Name: N/A    Number of Children: N/A  . Years of Education: N/A   Occupational History  . Not on file.   Social History Main Topics  . Smoking status: Not on file  . Smokeless tobacco: Not on file  . Alcohol Use: Not on file  . Drug Use: Not on file  . Sexually Active: Not on file   Other Topics Concern  . Not on file   Social History Narrative  . No narrative on file    No current  facility-administered medications on file prior to encounter.   No current outpatient prescriptions on file prior to encounter.    Allergies not on file  ROS Pertinent items in HPI  PHYSICAL EXAM Filed Vitals:   04/26/12 1744  BP: 129/62  Pulse: 99  Temp: 98 F (36.7 C)  Resp: 18   General: Obese female who looks in moderate discomfort Cardiovascular: Normal rate Respiratory: Normal effort Abdomen: Soft, protruberant, No upper abdominal tenderness, moderate tenderness both lower quadrants, no rebound or guarding Back: No CVAT Extremities: No edema Neurologic: Alert and oriented Speculum exam: NEFG; vagina with physiologic discharge, no blood; cervix clean Bimanual exam: cervix closed, some CMT; uterus tender but difficult to outline d/t body habitus; no adnexal masses, left adnexal tenderness  LAB RESULTS Results for orders placed during the hospital encounter of 04/26/12 (from the past 24 hour(s))  WET PREP, GENITAL     Status: Abnormal   Collection Time   04/26/12  6:07 PM      Component Value Range   Yeast Wet Prep HPF POC NONE SEEN  NONE SEEN   Trich, Wet Prep NONE SEEN  NONE SEEN   Clue Cells Wet Prep HPF POC MODERATE (*) NONE SEEN   WBC, Wet Prep HPF POC FEW (*) NONE SEEN  CBC     Status: Abnormal   Collection Time   04/26/12  6:12 PM      Component Value Range   WBC 13.9 (*) 4.0 - 10.5 K/uL   RBC 3.81 (*) 3.87 - 5.11 MIL/uL   Hemoglobin 11.0 (*) 12.0 - 15.0 g/dL   HCT 16.1 (*) 09.6 - 04.5 %   MCV 87.1  78.0 - 100.0 fL   MCH 28.9  26.0 - 34.0 pg   MCHC 33.1  30.0 - 36.0 g/dL   RDW 40.9  81.1 - 91.4 %   Platelets 303  150 - 400 K/uL  URINALYSIS, ROUTINE W REFLEX MICROSCOPIC     Status: Normal   Collection Time   04/26/12  7:05 PM      Component Value Range   Color, Urine YELLOW  YELLOW   APPearance CLEAR  CLEAR   Specific Gravity, Urine 1.010  1.005 - 1.030   pH 8.0  5.0 - 8.0   Glucose, UA NEGATIVE  NEGATIVE mg/dL   Hgb urine dipstick NEGATIVE   NEGATIVE   Bilirubin Urine NEGATIVE  NEGATIVE   Ketones, ur NEGATIVE  NEGATIVE mg/dL   Protein, ur NEGATIVE  NEGATIVE mg/dL   Urobilinogen, UA 0.2  0.0 - 1.0 mg/dL   Nitrite NEGATIVE  NEGATIVE   Leukocytes, UA NEGATIVE  NEGATIVE    IMAGING US Transvaginal Non-ob  04/26/2012  *RADIOLOGY REPORT*  Clinical Data: Pelvic pain.  TRANSABDOMINAL AND TRANSVAGINAL ULTRASOUND OF PELVIS Technique:  Both transabdominal and transvaginal ultrasound examinations of the pelvis were performed. Transabdominal technique was performed for global imaging of the pelvis including uterus, ovaries, adnexal regions, and pelvic cul-de-sac.  It was necessary to proceed with endovaginal exam following the transabdominal exam to visualize the uterus and ovaries in greater detail.  Comparison:  Pelvic ultrasound performed 06/01/2008  Findings:  Uterus: Normal in size; myometrial echogenicity is somewhat heterogeneous in appearance, raising question for mild adenomyosis. The uterus measures 11.8 x 6.2 x 5.6 cm.  Endometrium: Normal in thickness and appearance; measures 0.7 cm in thickness.  Right ovary:  There is a 4.2 x 3.0 x 3.4 cm complex cyst at the right ovary, with a lace-like appearance most likely reflecting an organizing hemorrhagic cyst.  The right ovary measures 5.7 x 3.5 x 4.0 cm.  Left ovary: Normal appearance/no adnexal mass; measures 3.4 x 2.2 x 2.0 cm.  Other findings: A trace amount of free fluid is noted within the pelvic cul-de-sac.  IMPRESSION:  1.  Mild heterogeneity of myometrial echogenicity raises concern for mild adenomyosis, slightly more prominent than in 2009. 2.  Likely 4.2 cm organizing hemorrhagic cyst noted at the right ovary.  Per current guidelines, no follow-up is required.   Original Report Authenticated By: Tonia Ghent, M.D.    US Pelvis Complete  04/26/2012  *RADIOLOGY REPORT*  Clinical Data: Pelvic pain.  TRANSABDOMINAL AND TRANSVAGINAL ULTRASOUND OF PELVIS Technique:  Both transabdominal and  transvaginal ultrasound examinations of the pelvis were performed. Transabdominal technique was performed for global imaging of the pelvis including uterus, ovaries, adnexal regions,  and pelvic cul-de-sac.  It was necessary to proceed with endovaginal exam following the transabdominal exam to visualize the uterus and ovaries in greater detail.  Comparison:  Pelvic ultrasound performed 06/01/2008  Findings:  Uterus: Normal in size; myometrial echogenicity is somewhat heterogeneous in appearance, raising question for mild adenomyosis. The uterus measures 11.8 x 6.2 x 5.6 cm.  Endometrium: Normal in thickness and appearance; measures 0.7 cm in thickness.  Right ovary:  There is a 4.2 x 3.0 x 3.4 cm complex cyst at the right ovary, with a lace-like appearance most likely reflecting an organizing hemorrhagic cyst.  The right ovary measures 5.7 x 3.5 x 4.0 cm.  Left ovary: Normal appearance/no adnexal mass; measures 3.4 x 2.2 x 2.0 cm.  Other findings: A trace amount of free fluid is noted within the pelvic cul-de-sac.  IMPRESSION:  1.  Mild heterogeneity of myometrial echogenicity raises concern for mild adenomyosis, slightly more prominent than in 2009. 2.  Likely 4.2 cm organizing hemorrhagic cyst noted at the right ovary.  Per current guidelines, no follow-up is required.   Original Report Authenticated By: Tonia Ghent, M.D.     MAU COURSE Toradol 60 mg IM given GC/CT sent  ASSESSMENT 1. Pelvic pain   2. BV (bacterial vaginosis)     PLAN Discharge home. See AVS for patient education.  Follow-up Information    On 05/06/2012 to follow up. (Keep you her scheduled appointment with [facility]  )            Danae Orleans, CNM 04/26/2012 5:53 PM   Care assumed by [facility], NP to review pelvic US Patent returned from ultrasound Assessment: 46 y.o. female with right ovarian cyst   Early adenomyosis   Bacterial vaginosis    Plan:  Follow up as scheduled, return here as needed   Rx  Flagyl   Medication List     As of 04/26/2012 11:59 PM    START taking these medications         ibuprofen 600 MG tablet   Commonly known as: ADVIL,MOTRIN   Take 1 tablet (600 mg total) by mouth every 6 (six) hours as needed for pain.      metroNIDAZOLE 500 MG tablet   Commonly known as: FLAGYL   Take 1 tablet (500 mg total) by mouth 2 (two) times daily.      CONTINUE taking these medications         BYETTA 5 MCG PEN [place]      fish oil-omega-3 fatty acids 1000 MG capsule      LISINOPRIL PO      metFORMIN 500 MG tablet   Commonly known as: GLUCOPHAGE      prenatal multivitamin Tabs      VITAMIN D PO          Where to get your medications    These are the prescriptions that you need to pick up. We sent them to a specific pharmacy, so you will need to go there to get them.   [facility] [address] [place], [place] - [address]    [address] [place] [address]    Phone: [phone]        ibuprofen 600 MG tablet   metroNIDAZOLE 500 MG tablet

## 2012-04-26 NOTE — MAU Note (Signed)
Pt has hx of fibroids. Started having very bad pelvic pain today when she went to get in her car.  Pt has appointment with  Audree Bane  On 12/4

## 2012-04-27 LAB — GC/CHLAMYDIA PROBE AMP, GENITAL
Chlamydia, DNA Probe: NEGATIVE
GC Probe Amp, Genital: NEGATIVE

## 2014-01-29 IMAGING — US US PELVIS COMPLETE
1 series · 13 of 25 positions shown · non-contrast
Comparison: Pelvic ultrasound performed 06/01/2008

CLINICAL DATA: Pelvic pain.

TRANSABDOMINAL AND TRANSVAGINAL ULTRASOUND OF PELVIS
TECHNIQUE: Both transabdominal and transvaginal ultrasound
examinations of the pelvis were performed. Transabdominal technique
was performed for global imaging of the pelvis including uterus,
ovaries, adnexal regions, and pelvic cul-de-sac.
It was necessary to proceed with endovaginal exam following the
transabdominal exam to visualize the uterus and ovaries in greater
detail.

[Series 1: us transvaginal non-ob · 61 acquisitions, 13 frames shown]
[im 1/61]
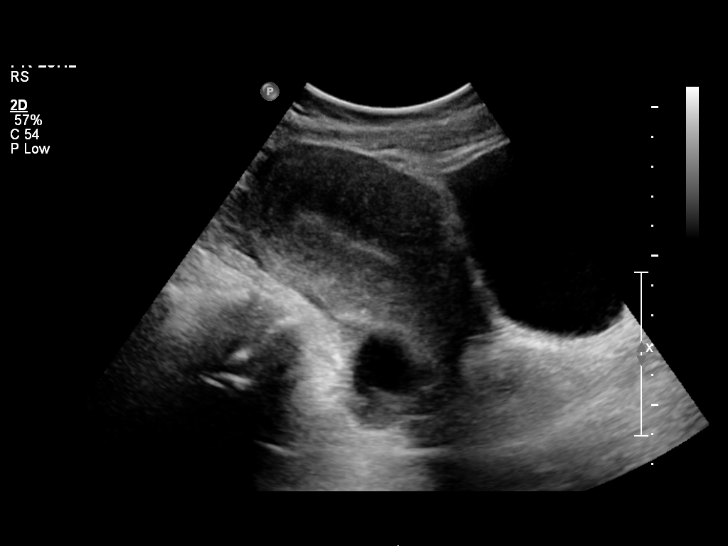
[im 6/61]
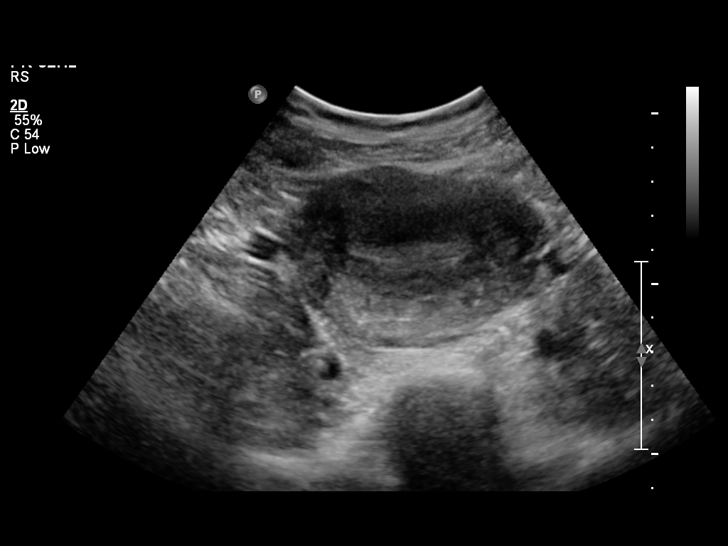
[im 11/61]
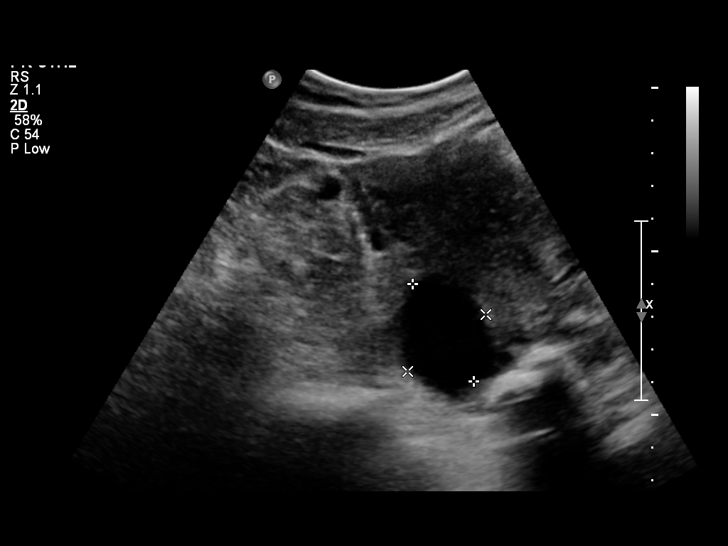
[im 16/61]
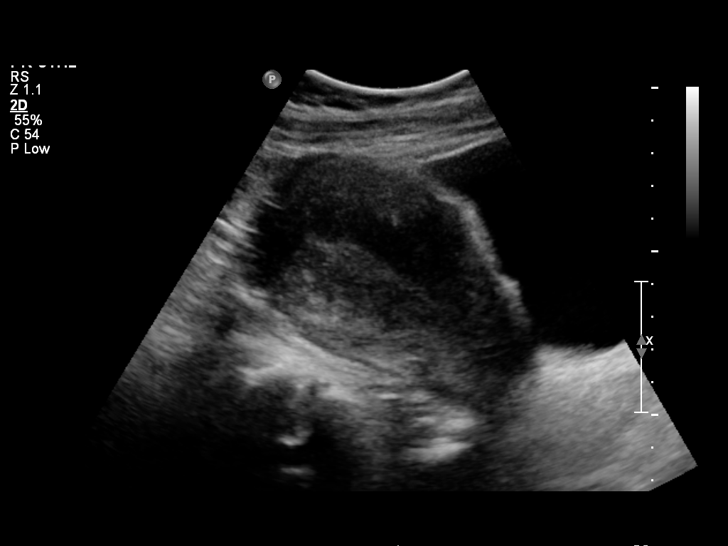
[im 21/61]
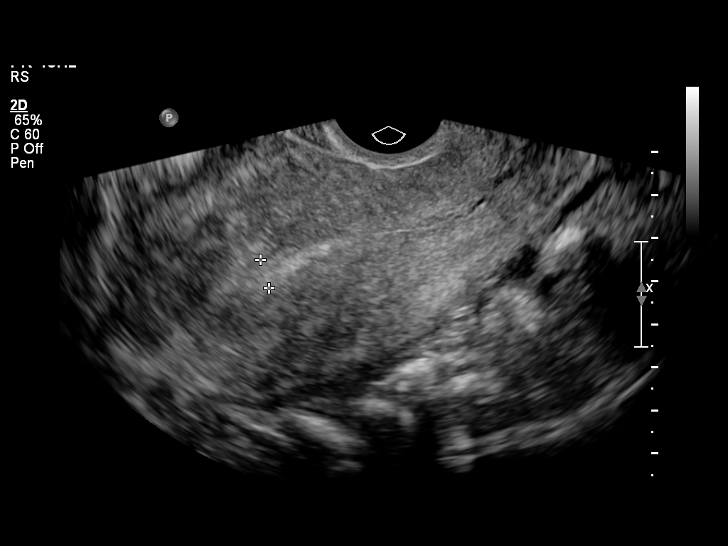
[im 26/61]
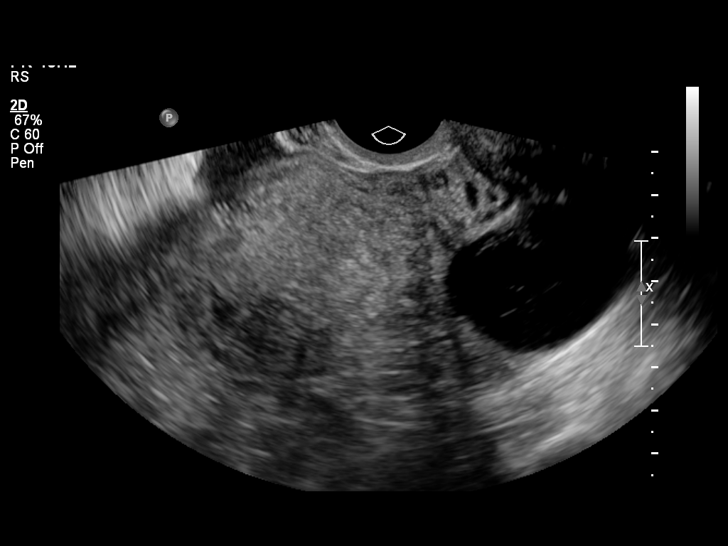
[im 31/61]
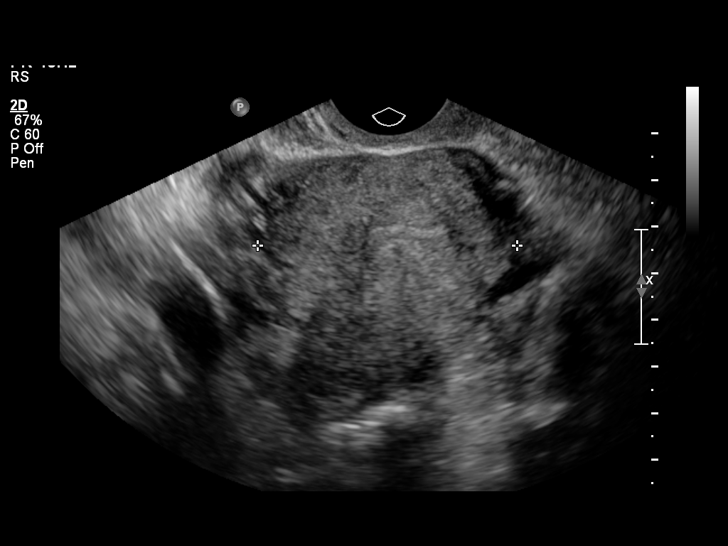
[im 36/61]
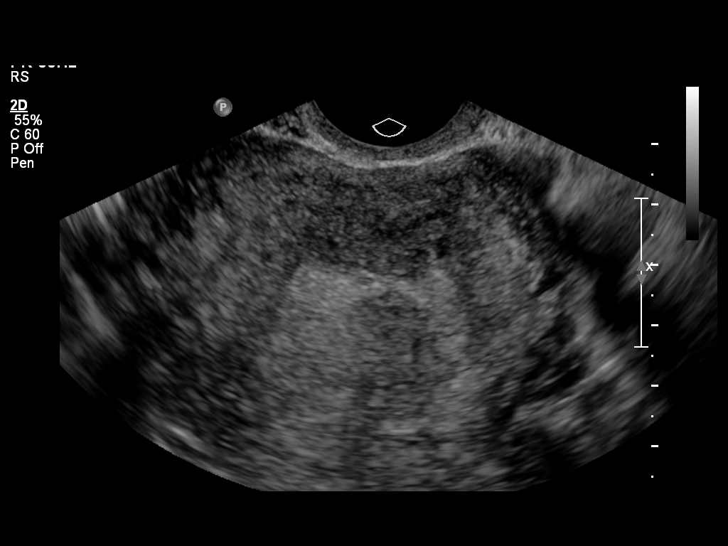
[im 41/61]
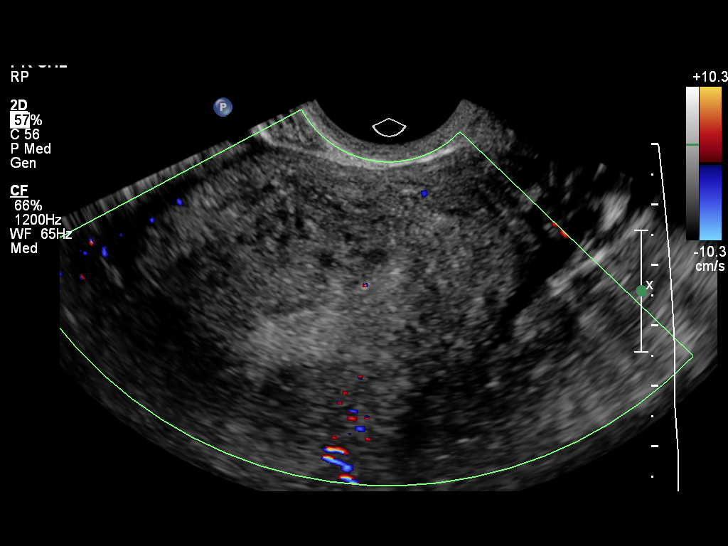
[im 46/61]
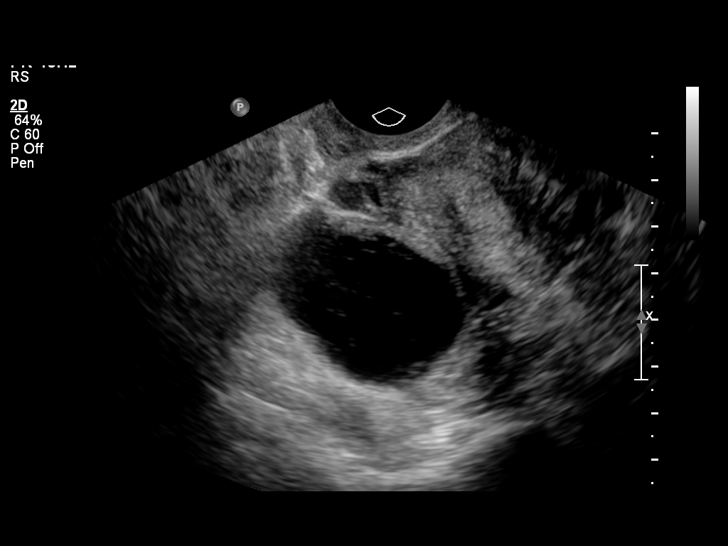
[im 51/61]
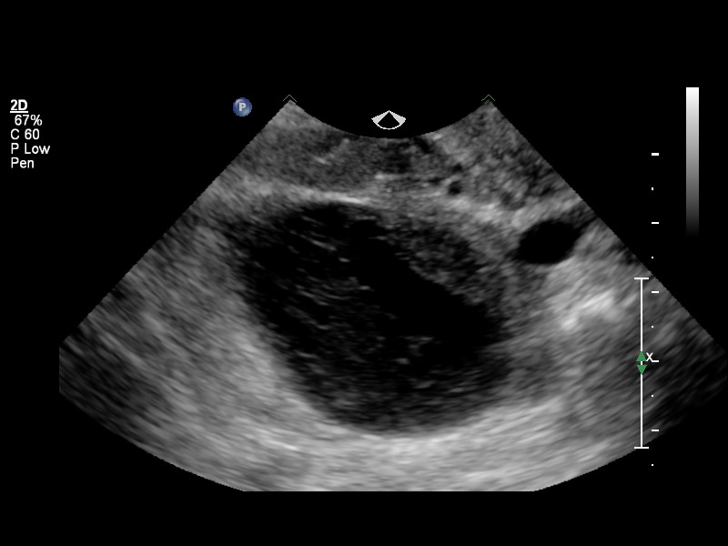
[im 56/61]
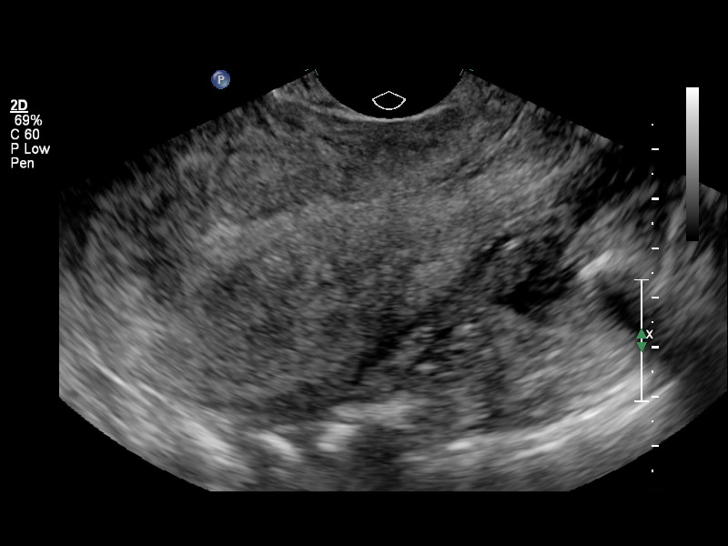
[im 61/61]
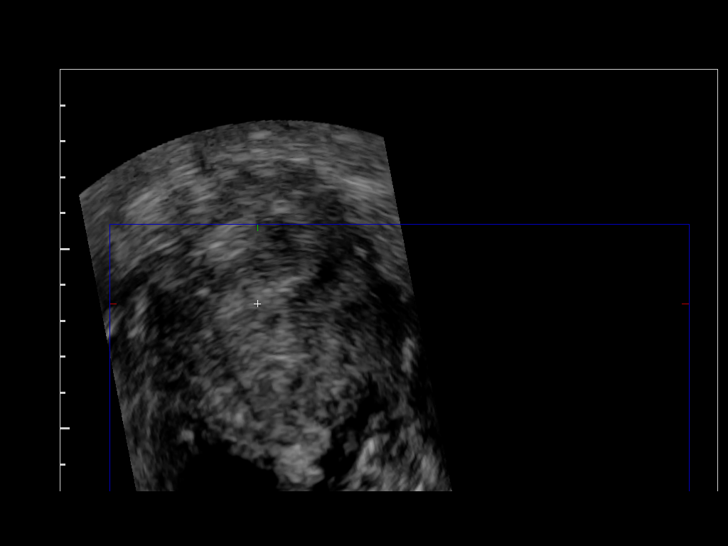

[13 of 25 positions shown; findings below may reference images not displayed]

FINDINGS: Uterus: Normal in size; myometrial echogenicity is somewhat
heterogeneous in appearance, raising question for mild adenomyosis.
The uterus measures 11.8 x 6.2 x 5.6 cm.

Endometrium: Normal in thickness and appearance; measures 0.7 cm in
thickness.

Right ovary:  There is a 4.2 x 3.0 x 3.4 cm complex cyst at the
right ovary, with a lace-like appearance most likely reflecting an
organizing hemorrhagic cyst.  The right ovary measures 5.7 x 3.5 x
4.0 cm.

Left ovary: Normal appearance/no adnexal mass; measures 3.4 x 2.2 x
2.0 cm.

Other findings: A trace amount of free fluid is noted within the
pelvic cul-de-sac.
IMPRESSION: 1.  Mild heterogeneity of myometrial echogenicity raises concern
for mild adenomyosis, slightly more prominent than in 1221.
2.  Likely 4.2 cm organizing hemorrhagic cyst noted at the right
ovary.  Per current guidelines, no follow-up is required.

## 2014-04-04 ENCOUNTER — Encounter (HOSPITAL_COMMUNITY): Payer: Self-pay | Admitting: *Deleted

## 2021-09-13 ENCOUNTER — Other Ambulatory Visit: Payer: Self-pay

## 2021-09-13 ENCOUNTER — Ambulatory Visit
Admission: EM | Admit: 2021-09-13 | Discharge: 2021-09-13 | Disposition: A | Discharge status: Home / Self Care | Payer: PRIVATE HEALTH INSURANCE

## 2021-09-13 DIAGNOSIS — A084 Viral intestinal infection, unspecified: Secondary | ICD-10-CM | POA: Diagnosis not present

## 2021-09-13 DIAGNOSIS — R197 Diarrhea, unspecified: Secondary | ICD-10-CM | POA: Diagnosis not present

## 2021-09-13 DIAGNOSIS — R112 Nausea with vomiting, unspecified: Secondary | ICD-10-CM

## 2021-09-13 MED ORDER — ONDANSETRON 4 MG PO TBDP: 4.0000 mg | ORAL_TABLET | Freq: Three times a day (TID) | ORAL | 0 refills | Status: DC | PRN

## 2021-09-13 NOTE — Discharge Instructions (Signed)
It appears that you have a viral stomach virus that should run its course and self resolve in the next few days.  You have been prescribed a nausea medication to take as needed. Please ensure adequate fluid hydration. Please go the hospital if symptoms persist or worsen. ?

## 2021-09-13 NOTE — ED Triage Notes (Signed)
Patient presents to Urgent Care with complaints of loose bowels, nausea, dry heaving since last night. Patient reports body aches, weakness, and st she is in menopause and initially thought it was that but st she doesn't feel well.  Pmh of gastic bypass ?

## 2021-09-13 NOTE — ED Provider Notes (Signed)
?[facility] ? ? ? ?CSN: [id number] ?Arrival date & time: 09/13/21  1150 ? ? ?  ? ?History   ?Chief Complaint ?Chief Complaint  ?Patient presents with  ? Nausea  ? ? ?HPI ?JAYLEEN SCAGLIONE is a 56 y.o. female.  ? ?Patient presents with nausea, vomiting, diarrhea that started last night.  Patient also presenting with body aches.  Denies any known fevers.  Denies any known sick contacts.  Denies any associated respiratory symptoms or sore throat.  Patient has been able to keep fluids down.  Denies blood in stool or emesis.  Denies any associated abdominal pain but does report some cramping occasionally.  Patient has history of gastric bypass surgery approximately 5 years ago. Patient requesting covid 19 test. ? ? ? ?Past Medical History:  ?Diagnosis Date  ? Anemia   ? Diabetes mellitus without complication ([place])   ? Fibroid   ? ? ?Patient Active Problem List  ? Diagnosis Date Noted  ? UPPER RESPIRATORY INFECTION 03/12/2010  ? URINALYSIS, ABNORMAL 03/08/2008  ? GERD 08/26/2007  ? COUGH 08/25/2007  ? DYSMENORRHEA 02/19/2007  ? FIBROIDS, UTERUS 02/15/2007  ? CARPAL TUNNEL SYNDROME, BILATERAL 02/15/2007  ? MENORRHAGIA 02/15/2007  ? DEPENDENT EDEMA, LEGS 02/15/2007  ? DIABETES MELLITUS, TYPE II, CONTROLLED 04/10/2006  ? DYSLIPIDEMIA 04/10/2006  ? ? ?Past Surgical History:  ?Procedure Laterality Date  ? TUBAL LIGATION    ? ? ?OB History   ? ? Gravida  ?7  ? Para  ?5  ? Term  ?5  ? Preterm  ?0  ? AB  ?2  ? Living  ?5  ?  ? ? SAB  ?2  ? IAB  ?0  ? Ectopic  ?0  ? Multiple  ?0  ? Live Births  ?   ?   ?  ?  ? ? ? ?Home Medications   ? ?Prior to Admission medications   ?Medication Sig Start Date End Date Taking? Authorizing Provider  ?ondansetron (ZOFRAN-ODT) 4 MG disintegrating tablet Take 1 tablet (4 mg total) by mouth every 8 (eight) hours as needed for nausea or vomiting. 09/13/21  Yes Teodora Medici, FNP  ?calcium citrate (CALCITRATE - DOSED IN MG ELEMENTAL CALCIUM) 950 (200 Ca) MG tablet Take 950 mg by mouth.     [provider]  ?Cholecalciferol (VITAMIN D PO) Take 1 tablet by mouth daily.    [provider]  ?Exenatide (BYETTA 5 MCG PEN [place]) Inject into the skin 2 (two) times daily.    [provider]  ?fish oil-omega-3 fatty acids 1000 MG capsule Take 1 capsule by mouth daily.    [provider]  ?ibuprofen (ADVIL,MOTRIN) 600 MG tablet Take 1 tablet (600 mg total) by mouth every 6 (six) hours as needed for pain. 04/26/12   Poe, Deirdre Loletha Grayer, CNM  ?LISINOPRIL PO Take 1 tablet by mouth daily.    [provider]  ?metFORMIN (GLUCOPHAGE) 500 MG tablet Take 500 mg by mouth 2 (two) times daily with a meal.    [provider]  ?Prenatal Vit-Fe Fumarate-FA (PRENATAL MULTIVITAMIN) TABS Take 1 tablet by mouth daily.    [provider]  ? ? ?Family History ?History reviewed. No pertinent family history. ? ?Social History ?Social History  ? ?Substance Use Topics  ? Alcohol use: No  ? Drug use: No  ? ? ? ?Allergies   ?Patient has no known allergies. ? ? ?Review of Systems ?Review of Systems ?Per HPI ? ?Physical Exam ?Triage Vital  Signs ?ED Triage Vitals  ?Enc Vitals Group  ?   BP 09/13/21 1217 119/74  ?   Pulse Rate 09/13/21 1217 83  ?   Resp 09/13/21 1217 18  ?   Temp 09/13/21 1217 97.9 ?F (36.6 ?C)  ?   Temp Source 09/13/21 1217 Oral  ?   SpO2 09/13/21 1217 97 %  ?   Weight 09/13/21 1213 186 lb (84.4 kg)  ?   Height 09/13/21 1213 '5\' 7"'$  (1.702 m)  ?   Head Circumference --   ?   Peak Flow --   ?   Pain Score 09/13/21 1213 7  ?   Pain Loc --   ?   Pain Edu? --   ?   Excl. in [place]? --   ? ?No data found. ? ?Updated Vital Signs ?BP 119/74 (BP Location: Right Arm)   Pulse 83   Temp 97.9 ?F (36.6 ?C) (Oral)   Resp 18   Ht '5\' 7"'$  (1.702 m)   Wt 186 lb (84.4 kg)   SpO2 97%   BMI 29.13 kg/m?  ? ?Visual Acuity ?Right Eye Distance:   ?Left Eye Distance:   ?Bilateral Distance:   ? ?Right Eye Near:   ?Left Eye Near:    ?Bilateral Near:    ? ?Physical Exam ?Constitutional:   ?    General: She is not in acute distress. ?   Appearance: Normal appearance. She is not toxic-appearing or diaphoretic.  ?HENT:  ?   Head: Normocephalic and atraumatic.  ?   Mouth/Throat:  ?   Mouth: Mucous membranes are moist.  ?   Pharynx: No posterior oropharyngeal erythema.  ?Eyes:  ?   Extraocular Movements: Extraocular movements intact.  ?   Conjunctiva/sclera: Conjunctivae normal.  ?Cardiovascular:  ?   Rate and Rhythm: Normal rate and regular rhythm.  ?   Pulses: Normal pulses.  ?   Heart sounds: Normal heart sounds.  ?Pulmonary:  ?   Effort: Pulmonary effort is normal. No respiratory distress.  ?   Breath sounds: Normal breath sounds.  ?Abdominal:  ?   General: Abdomen is flat. Bowel sounds are normal. There is no distension.  ?   Palpations: Abdomen is soft.  ?   Tenderness: There is no abdominal tenderness.  ?Skin: ?   General: Skin is warm and dry.  ?Neurological:  ?   General: No focal deficit present.  ?   Mental Status: She is alert and oriented to person, place, and time. Mental status is at baseline.  ?Psychiatric:     ?   Mood and Affect: Mood normal.     ?   Behavior: Behavior normal.     ?   Thought Content: Thought content normal.     ?   Judgment: Judgment normal.  ? ? ? ?UC Treatments / Results  ?Labs ?(all labs ordered are listed, but only abnormal results are displayed) ?Labs Reviewed  ?NOVEL CORONAVIRUS, NAA  ? ? ?EKG ? ? ?Radiology ?No results found. ? ?Procedures ?Procedures (including critical care time) ? ?Medications Ordered in UC ?Medications - No data to display ? ?Initial Impression / Assessment and Plan / UC Course  ?I have reviewed the triage vital signs and the nursing notes. ? ?Pertinent labs & imaging results that were available during my care of the patient were reviewed by me and considered in my medical decision making (see chart for details). ? ?  ? ?Symptoms appear viral in etiology.  Do not  think the patient is in need of emergent evaluation at the hospital at this time  given physical exam, symptoms, vital signs.  Patient was given strict return and ER precautions.  No signs of dehydration at this time.  Patient advised to ensure adequate fluid hydration.  Ondansetron prescribed for patient as she reports that she has taken this and tolerated well in the past.  Patient verbalized understanding and was agreeable with plan. ?Final Clinical Impressions(s) / UC Diagnoses  ? ?Final diagnoses:  ?Viral gastroenteritis  ?Nausea vomiting and diarrhea  ? ? ? ?Discharge Instructions   ? ?  ?It appears that you have a viral stomach virus that should run its course and self resolve in the next few days.  You have been prescribed a nausea medication to take as needed. Please ensure adequate fluid hydration. Please go the hospital if symptoms persist or worsen. ? ? ? ?ED Prescriptions   ? ? Medication Sig Dispense Auth. Provider  ? ondansetron (ZOFRAN-ODT) 4 MG disintegrating tablet Take 1 tablet (4 mg total) by mouth every 8 (eight) hours as needed for nausea or vomiting. 20 tablet Oswaldo Conroy E, [place]  ? ?  ? ?PDMP not reviewed this encounter. ?  ?Teodora Medici, [place] ?09/13/21 1231 ? ?

## 2021-09-14 LAB — NOVEL CORONAVIRUS, NAA: SARS-CoV-2, NAA: NOT DETECTED

## 2022-05-03 DIAGNOSIS — Z419 Encounter for procedure for purposes other than remedying health state, unspecified: Secondary | ICD-10-CM | POA: Diagnosis not present

## 2022-06-03 DIAGNOSIS — Z419 Encounter for procedure for purposes other than remedying health state, unspecified: Secondary | ICD-10-CM | POA: Diagnosis not present

## 2022-07-04 DIAGNOSIS — Z419 Encounter for procedure for purposes other than remedying health state, unspecified: Secondary | ICD-10-CM | POA: Diagnosis not present

## 2022-08-02 DIAGNOSIS — Z419 Encounter for procedure for purposes other than remedying health state, unspecified: Secondary | ICD-10-CM | POA: Diagnosis not present

## 2022-09-02 DIAGNOSIS — Z419 Encounter for procedure for purposes other than remedying health state, unspecified: Secondary | ICD-10-CM | POA: Diagnosis not present

## 2022-10-02 DIAGNOSIS — Z419 Encounter for procedure for purposes other than remedying health state, unspecified: Secondary | ICD-10-CM | POA: Diagnosis not present

## 2022-11-02 DIAGNOSIS — Z419 Encounter for procedure for purposes other than remedying health state, unspecified: Secondary | ICD-10-CM | POA: Diagnosis not present

## 2022-12-02 DIAGNOSIS — Z419 Encounter for procedure for purposes other than remedying health state, unspecified: Secondary | ICD-10-CM | POA: Diagnosis not present

## 2023-01-02 DIAGNOSIS — Z419 Encounter for procedure for purposes other than remedying health state, unspecified: Secondary | ICD-10-CM | POA: Diagnosis not present

## 2023-02-02 DIAGNOSIS — Z419 Encounter for procedure for purposes other than remedying health state, unspecified: Secondary | ICD-10-CM | POA: Diagnosis not present

## 2023-03-04 DIAGNOSIS — Z419 Encounter for procedure for purposes other than remedying health state, unspecified: Secondary | ICD-10-CM | POA: Diagnosis not present

## 2023-04-04 DIAGNOSIS — Z419 Encounter for procedure for purposes other than remedying health state, unspecified: Secondary | ICD-10-CM | POA: Diagnosis not present

## 2023-04-21 ENCOUNTER — Inpatient Hospital Stay (HOSPITAL_COMMUNITY)
Admission: EM | Admit: 2023-04-21 | Discharge: 2023-04-24 | DRG: 391 | Disposition: A | Discharge status: Home / Self Care | Payer: Medicaid Other | Attending: Family Medicine | Admitting: Family Medicine

## 2023-04-21 ENCOUNTER — Observation Stay (HOSPITAL_COMMUNITY): Payer: Medicaid Other

## 2023-04-21 ENCOUNTER — Encounter (HOSPITAL_COMMUNITY): Payer: Self-pay

## 2023-04-21 ENCOUNTER — Emergency Department (HOSPITAL_COMMUNITY): Payer: Medicaid Other

## 2023-04-21 ENCOUNTER — Other Ambulatory Visit: Payer: Self-pay

## 2023-04-21 DIAGNOSIS — R1084 Generalized abdominal pain: Secondary | ICD-10-CM | POA: Diagnosis not present

## 2023-04-21 DIAGNOSIS — R188 Other ascites: Secondary | ICD-10-CM | POA: Diagnosis present

## 2023-04-21 DIAGNOSIS — Z743 Need for continuous supervision: Secondary | ICD-10-CM | POA: Diagnosis not present

## 2023-04-21 DIAGNOSIS — I10 Essential (primary) hypertension: Secondary | ICD-10-CM | POA: Diagnosis not present

## 2023-04-21 DIAGNOSIS — N390 Urinary tract infection, site not specified: Secondary | ICD-10-CM | POA: Diagnosis not present

## 2023-04-21 DIAGNOSIS — E785 Hyperlipidemia, unspecified: Secondary | ICD-10-CM | POA: Diagnosis present

## 2023-04-21 DIAGNOSIS — R231 Pallor: Secondary | ICD-10-CM | POA: Diagnosis not present

## 2023-04-21 DIAGNOSIS — R112 Nausea with vomiting, unspecified: Secondary | ICD-10-CM | POA: Diagnosis present

## 2023-04-21 DIAGNOSIS — J189 Pneumonia, unspecified organism: Secondary | ICD-10-CM | POA: Diagnosis present

## 2023-04-21 DIAGNOSIS — R109 Unspecified abdominal pain: Secondary | ICD-10-CM | POA: Diagnosis present

## 2023-04-21 DIAGNOSIS — E559 Vitamin D deficiency, unspecified: Secondary | ICD-10-CM | POA: Diagnosis present

## 2023-04-21 DIAGNOSIS — Z79899 Other long term (current) drug therapy: Secondary | ICD-10-CM

## 2023-04-21 DIAGNOSIS — E876 Hypokalemia: Secondary | ICD-10-CM | POA: Diagnosis not present

## 2023-04-21 DIAGNOSIS — K529 Noninfective gastroenteritis and colitis, unspecified: Principal | ICD-10-CM | POA: Diagnosis present

## 2023-04-21 DIAGNOSIS — R7303 Prediabetes: Secondary | ICD-10-CM | POA: Diagnosis present

## 2023-04-21 DIAGNOSIS — Z9884 Bariatric surgery status: Secondary | ICD-10-CM

## 2023-04-21 DIAGNOSIS — K219 Gastro-esophageal reflux disease without esophagitis: Secondary | ICD-10-CM | POA: Diagnosis present

## 2023-04-21 DIAGNOSIS — Z87440 Personal history of urinary (tract) infections: Secondary | ICD-10-CM

## 2023-04-21 DIAGNOSIS — E860 Dehydration: Secondary | ICD-10-CM | POA: Diagnosis present

## 2023-04-21 HISTORY — DX: Hypokalemia: E87.6

## 2023-04-21 LAB — HCG, SERUM, QUALITATIVE: Preg, Serum: NEGATIVE

## 2023-04-21 LAB — COMPREHENSIVE METABOLIC PANEL
ALT: 49 U/L — ABNORMAL HIGH (ref 0–44)
AST: 89 U/L — ABNORMAL HIGH (ref 15–41)
Albumin: 3.3 g/dL — ABNORMAL LOW (ref 3.5–5.0)
Alkaline Phosphatase: 76 U/L (ref 38–126)
Anion gap: 9 (ref 5–15)
BUN: 8 mg/dL (ref 6–20)
CO2: 21 mmol/L — ABNORMAL LOW (ref 22–32)
Calcium: 8 mg/dL — ABNORMAL LOW (ref 8.9–10.3)
Chloride: 105 mmol/L (ref 98–111)
Creatinine, Ser: 0.57 mg/dL (ref 0.44–1.00)
GFR, Estimated: >60 mL/min (ref >60)
Glucose, Bld: 210 mg/dL — ABNORMAL HIGH (ref 70–99)
Potassium: 2.9 mmol/L — ABNORMAL LOW (ref 3.5–5.1)
Sodium: 135 mmol/L (ref 135–145)
Total Bilirubin: 0.5 mg/dL (ref <1.2)
Total Protein: 6.8 g/dL (ref 6.5–8.1)

## 2023-04-21 LAB — RAPID URINE DRUG SCREEN, HOSP PERFORMED
Amphetamines: NOT DETECTED
Barbiturates: NOT DETECTED
Benzodiazepines: NOT DETECTED
Cocaine: NOT DETECTED
Opiates: POSITIVE — AB
Tetrahydrocannabinol: NOT DETECTED

## 2023-04-21 LAB — URINALYSIS, ROUTINE W REFLEX MICROSCOPIC
Bilirubin Urine: NEGATIVE
Glucose, UA: 50 mg/dL — AB
Ketones, ur: 20 mg/dL — AB
Leukocytes,Ua: NEGATIVE
Nitrite: POSITIVE — AB
Protein, ur: 100 mg/dL — AB
RBC / HPF: >50 RBC/hpf (ref 0–5)
Specific Gravity, Urine: >1.046 — ABNORMAL HIGH (ref 1.005–1.030)
pH: 5 (ref 5.0–8.0)

## 2023-04-21 LAB — CBC
HCT: 36.4 % (ref 36.0–46.0)
Hemoglobin: 12 g/dL (ref 12.0–15.0)
MCH: 30.4 pg (ref 26.0–34.0)
MCHC: 33 g/dL (ref 30.0–36.0)
MCV: 92.2 fL (ref 80.0–100.0)
Platelets: 346 10*3/uL (ref 150–400)
RBC: 3.95 MIL/uL (ref 3.87–5.11)
RDW: 13.1 % (ref 11.5–15.5)
WBC: 12.3 10*3/uL — ABNORMAL HIGH (ref 4.0–10.5)
nRBC: 0 % (ref 0.0–0.2)

## 2023-04-21 LAB — HEMOGLOBIN A1C
Hgb A1c MFr Bld: 5.7 % — ABNORMAL HIGH (ref 4.8–5.6)
Mean Plasma Glucose: 116.89 mg/dL

## 2023-04-21 LAB — LIPASE, BLOOD: Lipase: 25 U/L (ref 11–51)

## 2023-04-21 LAB — TROPONIN I (HIGH SENSITIVITY)
Troponin I (High Sensitivity): 3 ng/L (ref <18)
Troponin I (High Sensitivity): 3 ng/L (ref <18)

## 2023-04-21 LAB — CALCIUM: Calcium: 8.3 mg/dL — ABNORMAL LOW (ref 8.9–10.3)

## 2023-04-21 LAB — HIV ANTIBODY (ROUTINE TESTING W REFLEX): HIV Screen 4th Generation wRfx: NONREACTIVE

## 2023-04-21 LAB — ETHANOL: Alcohol, Ethyl (B): <10 mg/dL (ref <10)

## 2023-04-21 LAB — AMMONIA: Ammonia: <10 umol/L (ref 9–35)

## 2023-04-21 MED ORDER — THIAMINE HCL 100 MG/ML IJ SOLN
100.0000 mg | Freq: Every day | INTRAMUSCULAR | Status: DC
  Administered 2023-04-24: 100 mg via INTRAVENOUS
  Filled 2023-04-21 (×3): qty 2

## 2023-04-21 MED ORDER — HYDROMORPHONE HCL 1 MG/ML IJ SOLN
1.0000 mg | Freq: Once | INTRAMUSCULAR | Status: AC
  Administered 2023-04-21: 1 mg via INTRAVENOUS
  Filled 2023-04-21: qty 1

## 2023-04-21 MED ORDER — THIAMINE MONONITRATE 100 MG PO TABS
100.0000 mg | ORAL_TABLET | Freq: Every day | ORAL | Status: DC
  Administered 2023-04-21 – 2023-04-23 (×3): 100 mg via ORAL
  Filled 2023-04-21 (×5): qty 1

## 2023-04-21 MED ORDER — ENOXAPARIN SODIUM 40 MG/0.4ML IJ SOSY
40.0000 mg | PREFILLED_SYRINGE | INTRAMUSCULAR | Status: DC
  Administered 2023-04-22 – 2023-04-24 (×3): 40 mg via SUBCUTANEOUS
  Filled 2023-04-21 (×3): qty 0.4

## 2023-04-21 MED ORDER — MORPHINE SULFATE (PF) 4 MG/ML IV SOLN
4.0000 mg | Freq: Once | INTRAVENOUS | Status: AC
  Administered 2023-04-21: 4 mg via INTRAVENOUS
  Filled 2023-04-21: qty 1

## 2023-04-21 MED ORDER — HYDROMORPHONE HCL 1 MG/ML IJ SOLN
1.0000 mg | INTRAMUSCULAR | Status: DC | PRN
  Administered 2023-04-21 – 2023-04-22 (×3): 1 mg via INTRAVENOUS
  Filled 2023-04-21 (×3): qty 1

## 2023-04-21 MED ORDER — POTASSIUM CHLORIDE CRYS ER 20 MEQ PO TBCR
40.0000 meq | EXTENDED_RELEASE_TABLET | Freq: Once | ORAL | Status: AC
  Administered 2023-04-21: 40 meq via ORAL
  Filled 2023-04-21: qty 2

## 2023-04-21 MED ORDER — FOLIC ACID 1 MG PO TABS
1.0000 mg | ORAL_TABLET | Freq: Every day | ORAL | Status: DC
  Administered 2023-04-21 – 2023-04-24 (×4): 1 mg via ORAL
  Filled 2023-04-21 (×4): qty 1

## 2023-04-21 MED ORDER — IOHEXOL 350 MG/ML SOLN
75.0000 mL | Freq: Once | INTRAVENOUS | Status: AC | PRN
  Administered 2023-04-21: 75 mL via INTRAVENOUS

## 2023-04-21 MED ORDER — ALUM & MAG HYDROXIDE-SIMETH 200-200-20 MG/5ML PO SUSP
30.0000 mL | ORAL | Status: DC | PRN
  Administered 2023-04-21: 30 mL via ORAL
  Filled 2023-04-21: qty 30

## 2023-04-21 MED ORDER — PANTOPRAZOLE SODIUM 40 MG IV SOLR
40.0000 mg | Freq: Once | INTRAVENOUS | Status: AC
  Administered 2023-04-21: 40 mg via INTRAVENOUS
  Filled 2023-04-21: qty 10

## 2023-04-21 MED ORDER — ONDANSETRON HCL 4 MG/2ML IJ SOLN
4.0000 mg | Freq: Once | INTRAMUSCULAR | Status: AC
  Administered 2023-04-21: 4 mg via INTRAVENOUS
  Filled 2023-04-21: qty 2

## 2023-04-21 MED ORDER — ADULT MULTIVITAMIN W/MINERALS CH
1.0000 | ORAL_TABLET | Freq: Every day | ORAL | Status: DC
  Administered 2023-04-21 – 2023-04-24 (×4): 1 via ORAL
  Filled 2023-04-21 (×4): qty 1

## 2023-04-21 MED ORDER — SODIUM CHLORIDE 0.9 % IV SOLN
1.0000 g | Freq: Once | INTRAVENOUS | Status: AC
  Administered 2023-04-21: 1 g via INTRAVENOUS
  Filled 2023-04-21: qty 10

## 2023-04-21 MED ORDER — KCL IN DEXTROSE-NACL 20-5-0.45 MEQ/L-%-% IV SOLN
INTRAVENOUS | Status: AC
  Filled 2023-04-21 (×2): qty 1000

## 2023-04-21 MED ORDER — SODIUM CHLORIDE 0.9 % IV BOLUS: 1000.0000 mL | Freq: Once | INTRAVENOUS | Status: DC

## 2023-04-21 MED ORDER — KETOROLAC TROMETHAMINE 15 MG/ML IJ SOLN
15.0000 mg | Freq: Four times a day (QID) | INTRAMUSCULAR | Status: DC | PRN
  Administered 2023-04-22 (×2): 15 mg via INTRAVENOUS
  Filled 2023-04-21 (×2): qty 1

## 2023-04-21 NOTE — Plan of Care (Signed)
FMTS Interim Progress Note Alexandra Patrick is a 57 y.o. female admitted for severe abdominal pain.  S: Patient reports that pain has improved somewhat.  She is now able to mobilize in the bed including sitting up.  Reports that the pain intermittently returns and is severe and localized in her epigastric, right upper quadrant area.  She reports that her stomach is protruded more than usual.  She also reports having severe reflux.  She reports that she is trying to eat food.  She does not describe a bowel movement since being the hospital. She reported that she had been to a doctor in the remote past and was told that she had a mild nodule on her liver.  She denies dysuria or polyuria.  O: BP (!) 117/58 (BP Location: Right Arm)   Pulse 100   Temp 99.3 F (37.4 C) (Oral)   Resp 16   Ht 5\' 7"  (1.702 m)   Wt 84.4 kg   SpO2 97%   BMI 29.14 kg/m    Physical Exam Constitutional:      General: She is not in acute distress.    Appearance: She is not ill-appearing.  HENT:     Head: Normocephalic and atraumatic.  Cardiovascular:     Rate and Rhythm: Normal rate and regular rhythm.     Heart sounds: Normal heart sounds.  Pulmonary:     Effort: Pulmonary effort is normal.     Breath sounds: Normal breath sounds.  Abdominal:     Palpations: Abdomen is soft.     Tenderness: There is abdominal tenderness in the right upper quadrant and epigastric area. There is no guarding or rebound.  Skin:    General: Skin is warm and dry.  Neurological:     General: No focal deficit present.     Mental Status: She is alert.  Psychiatric:        Mood and Affect: Mood normal.        Behavior: Behavior normal.     A/P: Patient is having some clinical improvement in her symptoms.  RUQ ultrasound showed ascites but negative for cholelithiasis or acute cholecystitis.  Will continue to monitor and consider GI consult tomorrow patient is clinically not improving.  DDx includes gastroenteritis, severe GERD,  nephrolithiasis (back pain/hematuria), SBP (if develops fever/elevated WBC).  Ruled out acute abdomen.  Received 1 dose of hydromorphone 9 hours ago but none since. Continue current pain regimen Follow-up hepatitis panel, urine culture, repeat CMP/CBC/mag in the a.m. Advance diet as tolerated, encourage oral hydration Consider redosing PPI GI consult if not improving   Meryl Dare, MD 04/21/2023, 8:29 PM PGY-1, [facility] Service pager [phone]

## 2023-04-21 NOTE — ED Notes (Signed)
Pt returned from CT °

## 2023-04-21 NOTE — ED Triage Notes (Signed)
Pt arrived via EMS due to sudden onset of abdominal pain, nausea and diarrhea. EMS reports that pt drinks wine daily and pain started in chest but is mostly in her adomen.

## 2023-04-21 NOTE — ED Notes (Signed)
Patient back in room

## 2023-04-21 NOTE — ED Notes (Signed)
Patient transported to CT 

## 2023-04-21 NOTE — Assessment & Plan Note (Addendum)
Generalized abdominal pain that started in her chest suddenly this morning with associated N/V.  Troponin is negative.EKG unremarkable.  Mild leukocytosis at 12.3.  Mildly elevated liver enzymes CMP (AST/ALT 89,49 respectively).  Ammonia negative.    Pregnancy test negative. ETOH less than 10.  CT abdomen pelvis with contrast with moderate small bowel wall thickening, mild ascites, minimal bibasilar atelectasis with small left pleural effusion. - Admitted to FMTS, Dr. McDiarmid attending, med-surg - regular diet as tolerated - Right upper quadrant ultrasound - Hemoglobin A1c, calcium levels - Toradol 15 mg IV every 6 hours as needed for mild to moderate pain - Dilaudid 1 mg IV every 3 hours as needed for severe pain - Protonix 40 mg once - CIWA's - 3 glasses wine/day, 6/day on weekend - Folate, multivitamin, thiamine - Vital signs per floor protocol - Consider GI consult if symptoms do not improve - AM CMP, CBC, hepatitis panel

## 2023-04-21 NOTE — Assessment & Plan Note (Addendum)
Secondary to GI losses. Potassium 2.9 in ED, repleted with 40 mill equivalents - Will likely need additional potassium supplementation - AM CMP

## 2023-04-21 NOTE — ED Notes (Signed)
Hot packs provided.

## 2023-04-21 NOTE — Hospital Course (Addendum)
Alexandra Patrick is a 57 y.o.female with a history of T2DM, GERD, Roux-en-Y gastric bypass who was admitted to the family medicine teaching Service at [facility] for abdominal pain. Her hospital course is detailed below:  Generalized abdominal pain Generalized abdominal pain that started in her chest suddenly with associated N/V.  Troponin is negative. EKG unremarkable.  Mild leukocytosis.  Mildly elevated liver enzymes.  Ammonia negative.    Pregnancy test negative. ETOH less than 10.  CT abdomen pelvis with contrast with moderate small bowel wall thickening, mild ascites, minimal bibasilar atelectasis with small left pleural effusion.  GI curb sided and thought likely gastroenteritis versus severe GERD.  RUQ ultrasound showed ascites but negative for cholelithiasis or acute cholecystitis.  Started PPI and pain management. Pain improved by discharge.   UTI with hematuria Reports recent back pain which she associates with her UTIs in the past.  Some suspicion for nephrolithiasis given hematuria and back pain.  No dysuria or polyuria.  UA consistent with UTI.  Urine culture with multiple species.  Completed course of rocephin. Received diflucan 150mg  x1 for potential yeast infection.   Hypokalemia Initially hypokalemic secondary to GI losses.  Repleted and normalized by discharge.  Pneumonia Developed cough, SOB, fever on 11/20. CXR with left basilar PNA. Continued Rocephin course and started azithromycin. Discharged in stable condition with PO azithromycin and augmentin.    PCP Follow-up Recommendations: Ascites on RUQ [place] Pt plans to stop drinking wine when she leaves, f/u on this Started PPI in hospital, re-evaluate Ensure f/u with bariatric surgeon F/u pneumonia sx

## 2023-04-21 NOTE — H&P (Addendum)
Hospital Admission History and Physical Service Pager: [phone]  Patient name: Alexandra Patrick Medical record number: [id number] Date of Birth: [birth year] Age: 57 y.o. Gender: female  Primary Care Provider: [facility], [facility] Consultants: none Code Status: Full which was confirmed with patient Preferred Emergency Contact: Aylina, Chizmar (daughter) [phone]  Chief Complaint: abdominal pain  Assessment and Plan: Alexandra Patrick is a 57 y.o. female with PMHx DM, GERD, gastric bypass presenting with abdominal pain.  I suspect symptoms could be due to viral/bacterial gastroenteritis or severe GERD given recent diet changes, along with her current UTI  however differential is broad and includes  -Acute cholecystitis/cholelithiasis -no gallstones or gallbladder wall thickening on CT however patient does have right upper quadrant pain that waxes and wanes with history of gastric bypass -Kidney stone-no stone seen on CT with contrast however patient did have RBCs on her urine microscopy and takes vit D/calcium daily -SBP-mild ascites noted in pelvis and around liver and spleen on CT abdomen pelvis, could consider IR consult if symptoms do not improve or worsen -Pancreatitis-low suspicion due to lipase normal, no pancreatic changes on CT abdomen pelvis -SBO-patient reports her abdomen is distended, however no SBO noted on CT abdomen pelvis -Acute hepatitis-mildly elevated liver enzymes, obtaining hepatitis panel -Mesenteric ischemia-symptoms do not worsen after eating and no history vasculopathy however we will keep in mind given history of gastric bypass -ACS-pain started in patient's chest, very low suspicion for ACS given normal troponins and normal EKG with no cardiac history.  Will continue to monitor for worsening symptoms Assessment & Plan Generalized abdominal pain Generalized abdominal pain that started in her chest suddenly this morning with associated  N/V.  Troponin is negative.EKG unremarkable.  Mild leukocytosis at 12.3.  Mildly elevated liver enzymes CMP (AST/ALT 89,49 respectively).  Ammonia negative.    Pregnancy test negative. ETOH less than 10.  CT abdomen pelvis with contrast with moderate small bowel wall thickening, mild ascites, minimal bibasilar atelectasis with small left pleural effusion. - Admitted to [facility]MT[facility], Dr. McDiarmid attending, med-surg - regular diet as tolerated - Right upper quadrant ultrasound - Hemoglobin A1c, calcium levels - Toradol 15 mg IV every 6 hours as needed for mild to moderate pain - Dilaudid 1 mg IV every 3 hours as needed for severe pain - Protonix 40 mg once - CIWA's - 3 glasses wine/day, 6/day on weekend - Folate, multivitamin, thiamine - Vital signs per floor protocol - Consider GI consult if symptoms do not improve - AM CMP, CBC, hepatitis panel Urinary tract infection with hematuria, site unspecified Reports intermittent back pain over the last few days, states that she felt a UTI coming on however no urinary symptoms.  UA consistent with UTI, greater than 50 RBCs.  S/p Rocephin in the ED. - Urine culture pending - Consider renal ultrasound if suspicious for kidney stone increases Hypokalemia Secondary to GI losses. Potassium 2.9 in ED, repleted with 40 mill equivalents - Will likely need additional potassium supplementation - AM CMP   FEN/GI: regular VTE Prophylaxis: lovenox  Disposition: med-surg, observation status  History of Present Illness:  Alexandra Patrick is a 57 y.o. female with PMHx T2DM (pt denies), gastric bypass surgery who presented with diffuse abdominal pain onset 5 AM this morning.  States that the pain started on the left side of her chest and was sharp and then migrated to her abdomen.  Reports associated nausea and NBNB vomiting x 2-3 episodes at home this morning.  She reports that when she got out of bed she had a normal bowel movement and then her pain became severely  worse with diaphoresis.  She feels like her abdomen is distended.  She also notes intermittent back pain over the last several days and states she felt like a UTI was coming on.  Of note, patient reports that her GERD has been worse recently and she has been taking an antacid every day.  She ate pizza from [employer] last night around 8 PM.  Notes she was vegetarian for several years and began eating meat again 1 year ago.  Denies hematochezia, diarrhea, constipation, hematuria, dysuria, fevers, SOB, cough, leg swelling.   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  Patient drinks 3 glasses of wine per day, 6/day on the weekend.  Last drink was yesterday around 8 PM.  In the ED, patient had UA showing UTI and was treated with Rocephin and IVF.  Mild leukocytosis at 12.3.  Hypokalemic at 2.9, repleted with 40 meq.  AST and ALT mildly elevated to 89 and 49 respectively.  Troponins negative, EKG unremarkable.  CT abdomen pelvis with moderate small bowel wall thickening in left upper and right lower quadrants consistent with enteritis, mild ascites, status post gastric bypass, minimal bibasilar atelectasis with small left pleural effusion. Had dilaudid, morphine, and zofran in the ED with improvement.   Review Of Systems: Per HPI with the following additions: none  Pertinent Past Medical History: Type 2 diabetes (historically, pt denies currently) Dyslipidemia - no meds GERD Anemia Fibroids Vitamin D deficiency  Remainder reviewed in history tab.   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  Remainder reviewed in history tab.   Pertinent Social History: Tobacco use: No Alcohol use: 3 glasses of wine per day, 6 per day on the weekend, last drink yesterday around 8pm Other Substance use: no  substances Lives with brother, 3 dogs  Pertinent Family History: Mother - CHF Dad - MI  Remainder reviewed in history tab.   Important Outpatient Medications: Multivitamin Calcium  Potassium supplements [employer] Vitamin D  Remainder reviewed in medication history.   Objective: BP (!) 133/50   Pulse (!) 101   Temp 98.2 F (36.8 C)   Resp (!) 21   Ht 5\' 7"  (1.702 m)   Wt 84.4 kg   SpO2 99%   BMI 29.14 kg/m  Exam: General: In distress secondary to pain, nontoxic-appearing ENTM: Moist mucous membranes Cardiovascular: Regular rate and rhythm, no murmurs Respiratory: CTAB, no increased work of breathing on room air Gastrointestinal: Normal bowel sounds in all quadrants.  Diffuse, nonspecific tenderness to palpation throughout abdomen, appears worse in epigastric and right upper quadrant region.  No CVA tenderness bilaterally.  Abdomen is distended per patient and daughter. MSK: No leg swelling bilateral lower extremities Neuro: Alert and oriented x 4 Psych: Normal affect  Labs:  CBC BMET  Recent Labs  Lab 04/21/23 0625  WBC 12.3*  HGB 12.0  HCT 36.4  PLT 346   Recent Labs  Lab 04/21/23 0625  NA 135  K 2.9*  CL 105  CO2 21*  BUN 8  CREATININE 0.57  GLUCOSE 210*  CALCIUM 8.0*    UDS + opiates, drawn after opiates given the ED UA with UTI Troponin 3>3 EtOH <10 Hcg negative Ammonia <10 Lipase 25  EKG: NSR  at 75BPM. Qtc . No ST-T changes.    Imaging Studies Performed: CT Abdomen Pelvis W Contrast 04/21/23 IMPRESSION: Moderate small bowel wall thickening is noted in the left upper and right lower quadrants most consistent with enteritis. Mild ascites is noted.   Status post gastric bypass.   Minimal bibasilar subsegmental atelectasis is noted with small left pleural effusion.   Everhart, Kirstie, DO 04/21/2023, 3:03 PM PGY-1, [facility]  FPTS Intern pager: [phone], text pages welcome Secure chat group [facility] Teaching Service   I have verified that the resident's  findings are accurately documented in the resident's note. I have made edits and changes where appropriate, and agree with plan.  Celine Mans, MD, PGY-2 [facility] Family Medicine 3:35 PM 04/21/2023

## 2023-04-21 NOTE — ED Provider Notes (Signed)
[place] EMERGENCY DEPARTMENT AT [facility] Provider Note   CSN: [id number] Arrival date & time: 04/21/23  7829     History  Chief Complaint  Patient presents with   Abdominal Pain    Alexandra Patrick is a 57 y.o. female with past medical history of diabetes and gastric bypass surgery presents to the emergency department via EMS for evaluation of left sided chest pain and diffuse abdominal pain that started at 0500 this morning. She reports that sharp pain started in her left upper chest near her shoulder but is now mostly in abdomen. Patient has associated nausea and frequent burping. Patient reports that she drinks "two glasses of wine daily". Last BM was yesterday and per her normal. She denies vaginal bleeding, discharge, hematochezia, urinary symptoms.  Daughter reports that she called EMS due to debilitating pain and N/V causing patient to be unable to move her and needing assistance getting out of her home.  Upon initial assessment, patient is not forward with information and most HPI is obtained by daughter at bedside. Patient continuously repeats "help me" 2/2 to pain and discomfort.   Abdominal Pain Associated symptoms: chest pain, nausea and vomiting   Associated symptoms: no chills, no constipation, no cough, no diarrhea, no fatigue, no fever and no shortness of breath       Home Medications Prior to Admission medications   Medication Sig Start Date End Date Taking? Authorizing Provider  CALCIUM PO Take 1 tablet by mouth daily.   Yes [provider]  Cholecalciferol (VITAMIN D PO) Take 1 tablet by mouth daily.   Yes [provider]  Multiple Vitamins-Minerals (MULTIVITAMIN WITH MINERALS) tablet Take 1 tablet by mouth daily.   Yes [provider]      Allergies    Patient has no known allergies.    Review of Systems   Review of Systems  Constitutional:  Negative for chills, fatigue and fever.  Respiratory:  Negative for  cough, chest tightness, shortness of breath and wheezing.   Cardiovascular:  Positive for chest pain. Negative for palpitations and leg swelling.  Gastrointestinal:  Positive for abdominal pain, nausea and vomiting. Negative for constipation and diarrhea.  Neurological:  Negative for dizziness, seizures, weakness, light-headedness, numbness and headaches.    Physical Exam Updated Vital Signs BP 120/68   Pulse 93   Temp 97.9 F (36.6 C)   Resp 14   Ht 5\' 7"  (1.702 m)   Wt 84.4 kg   SpO2 95%   BMI 29.14 kg/m  Physical Exam Vitals and nursing note reviewed.  Constitutional:      Appearance: Normal appearance. She is not ill-appearing or diaphoretic.     Comments: Unable to sit still due to pain and discomfort  HENT:     Head: Normocephalic and atraumatic.  Eyes:     Conjunctiva/sclera: Conjunctivae normal.  Cardiovascular:     Rate and Rhythm: Normal rate.  Pulmonary:     Effort: Pulmonary effort is normal. No respiratory distress.  Abdominal:     General: There is no distension.     Palpations: Abdomen is soft.     Tenderness: There is abdominal tenderness (Diffuse). There is guarding. There is no right CVA tenderness or left CVA tenderness.  Skin:    Coloration: Skin is not jaundiced or pale.  Neurological:     Mental Status: She is alert. Mental status is at baseline.    ED Results / Procedures / Treatments   Labs (all labs  ordered are listed, but only abnormal results are displayed) Labs Reviewed  COMPREHENSIVE METABOLIC PANEL - Abnormal; Notable for the following components:      Result Value   Potassium 2.9 (*)    CO2 21 (*)    Glucose, Bld 210 (*)    Calcium 8.0 (*)    Albumin 3.3 (*)    AST 89 (*)    ALT 49 (*)    All other components within normal limits  CBC - Abnormal; Notable for the following components:   WBC 12.3 (*)    All other components within normal limits  URINALYSIS, ROUTINE W REFLEX MICROSCOPIC - Abnormal; Notable for the following  components:   APPearance HAZY (*)    Specific Gravity, Urine >1.046 (*)    Glucose, UA 50 (*)    Hgb urine dipstick LARGE (*)    Ketones, ur 20 (*)    Protein, ur 100 (*)    Nitrite POSITIVE (*)    Bacteria, UA RARE (*)    All other components within normal limits  RAPID URINE DRUG SCREEN, HOSP PERFORMED - Abnormal; Notable for the following components:   Opiates POSITIVE (*)    All other components within normal limits  URINE CULTURE  LIPASE, BLOOD  AMMONIA  HCG, SERUM, QUALITATIVE  ETHANOL  TROPONIN I (HIGH SENSITIVITY)  TROPONIN I (HIGH SENSITIVITY)    EKG EKG Interpretation Date/Time:  Monday April 21 2023 06:22:45 EST Ventricular Rate:  75 PR Interval:  136 QRS Duration:  86 QT Interval:  428 QTC Calculation: 477 R Axis:   15  Text Interpretation: Normal sinus rhythm with sinus arrhythmia Normal ECG No previous ECGs available Confirmed by Zadie Rhine ([id number]) on 04/21/2023 6:26:00 AM  Radiology CT ABDOMEN PELVIS W CONTRAST  Result Date: 04/21/2023 CLINICAL DATA:  Acute generalized abdominal pain. EXAM: CT ABDOMEN AND PELVIS WITH CONTRAST TECHNIQUE: Multidetector CT imaging of the abdomen and pelvis was performed using the standard protocol following bolus administration of intravenous contrast. RADIATION DOSE REDUCTION: This exam was performed according to the departmental dose-optimization program which includes automated exposure control, adjustment of the mA and/or kV according to patient size and/or use of iterative reconstruction technique. CONTRAST:  75mL OMNIPAQUE IOHEXOL 350 MG/ML SOLN COMPARISON:  None Available. FINDINGS: Lower chest: Minimal bibasilar subsegmental atelectasis is noted with small left pleural effusion. Hepatobiliary: No focal liver abnormality is seen. No gallstones, gallbladder wall thickening, or biliary dilatation. Pancreas: Unremarkable. No pancreatic ductal dilatation or surrounding inflammatory changes. Spleen: Normal in size without  focal abnormality. Adrenals/Urinary Tract: Adrenal glands are unremarkable. Kidneys are normal, without renal calculi, focal lesion, or hydronephrosis. Bladder is unremarkable. Stomach/Bowel: Status post gastric bypass. The appendix appears normal. There is no evidence of bowel obstruction. Moderate wall thickening is seen involving small bowel loops in the left upper and right lower quadrants most consistent with enteritis. No definite colonic dilatation is noted. Vascular/Lymphatic: No significant vascular findings are present. No enlarged abdominal or pelvic lymph nodes. Reproductive: Uterus and bilateral adnexa are unremarkable. Other: Mild ascites is noted in the pelvis and around the liver and spleen. Musculoskeletal: No acute or significant osseous findings. IMPRESSION: Moderate small bowel wall thickening is noted in the left upper and right lower quadrants most consistent with enteritis. Mild ascites is noted. Status post gastric bypass. Minimal bibasilar subsegmental atelectasis is noted with small left pleural effusion. Electronically Signed   By: Lupita Raider M.D.   On: 04/21/2023 12:34    Procedures Procedures  Medications Ordered in ED Medications  cefTRIAXone (ROCEPHIN) 1 g in sodium chloride 0.9 % 100 mL IVPB (has no administration in time range)  sodium chloride 0.9 % bolus 1,000 mL (has no administration in time range)  ondansetron (ZOFRAN) injection 4 mg (4 mg Intravenous Given 04/21/23 0658)  HYDROmorphone (DILAUDID) injection 1 mg (1 mg Intravenous Given 04/21/23 0700)  potassium chloride SA (KLOR-CON M) CR tablet 40 mEq (40 mEq Oral Given 04/21/23 1023)  HYDROmorphone (DILAUDID) injection 1 mg (1 mg Intravenous Given 04/21/23 0925)  iohexol (OMNIPAQUE) 350 MG/ML injection 75 mL (75 mLs Intravenous Contrast Given 04/21/23 1022)  morphine (PF) 4 MG/ML injection 4 mg (4 mg Intravenous Given 04/21/23 1212)    ED Course/ Medical Decision Making/ A&P                                  Medical Decision Making Amount and/or Complexity of Data Reviewed Labs: ordered. Radiology: ordered.  Risk Prescription drug management. Decision regarding hospitalization.   Patient presents to the ED for concern of chest and generalized abdominal pain since 0500, this involves an extensive number of treatment options, and is a complaint that carries with it a high risk of complications and morbidity.  The differential diagnosis includes ACS, pancreatitis, appendicitis, hepatitis, GB pathology, diverticulitis. This is not an exhaustive list.   Co morbidities that complicate the patient evaluation  None   Additional history obtained:  Additional history obtained from EMS, Family, Nursing, and Outside Medical Records   External records from outside source obtained and reviewed   Lab Tests:  I Ordered, and personally interpreted labs.  The pertinent results include: Hypokalemia of 2.9, mild leukocytosis of 12.3, transaminitis (AST 89 ALT 49) Troponin x 2 negative, hCG negative, lipase negative   Imaging Studies ordered:  I ordered imaging studies including CT abd pelvis with con  I independently visualized and interpreted imaging which showed  moderate small bowel wall thickening consistent with enteritis with mild ascites  I agree with the radiologist interpretation   Cardiac Monitoring:  The patient was maintained on a cardiac monitor.  I personally viewed and interpreted the cardiac monitored which showed an underlying rhythm of: NSR with no STE nor ischemia   Medicines ordered and prescription drug management:  I ordered medication including Zofran and Dilaudid for nausea and abd pain  Reevaluation of the patient after these medicines showed that the patient improved I have reviewed the patients home medicines and have made adjustments as needed   Consultations Obtained:  I requested consultation with the hospitalist,  and discussed lab and imaging  findings as well as pertinent plan - they recommend: admission   Problem List / ED Course:  Chest pain Abdominal pain   Reevaluation:  After the interventions noted above, I reevaluated the patient and found that they have :improved   Dispostion:  Patient is unable to remain still secondary to pain.  Initially, she is not forward with information due to discomfort.  Vital signs within normal limits.  She is hemodynamically stable with no fever.  See HPI.  Upon evaluation, patient is diffusely tender in abdomen to light palpation.  No masses or rigidity felt.  There is guarding.  No skin changes to chest, back, or back.  No TTP to chest.  Lung sounds CTAB. Will obtain CT.  Lab work is significant for mild leukocytosis of 12.3 and hypokalemia of 2.9. UA positive for UTI.  PO K+ supplementation provided. Will cover UTI with Rocephin and provide NS for dehydration. CT significant for moderate enteritis  After consideration of the diagnostic results and the patients response to treatment, I feel that the patent would benefit from admission for intractable pain and vomiting. Patient is unable to keep solids down and, despite 2 doses of Dilaudid and morphine, endorses debilitating abdominal pain.  Will provide 1 dose of Rocephin in ED for UTI.  Consulted hospitalist and Dr. McDiarmid accepts patient for admission  Discussed patient and plan with Dr. Adela Lank who agrees with plan.       Final Clinical Impression(s) / ED Diagnoses Final diagnoses:  None    Rx / DC Orders ED Discharge Orders     None         Judithann Sheen, PA 04/21/23 1418    Melene Plan, DO 04/22/23 1241

## 2023-04-21 NOTE — ED Notes (Signed)
ED TO INPATIENT HANDOFF REPORT  ED Nurse Name and Phone #: JOsh  S Name/Age/Gender Alexandra Patrick 57 y.o. female Room/Bed: 030C/030C  Code Status   Code Status: Full Code  Home/SNF/Other Home Patient oriented to: self, place, time, and situation Is this baseline? Yes   Triage Complete: Triage complete  Chief Complaint Abdominal pain [R10.9]  Triage Note Pt arrived via EMS due to sudden onset of abdominal pain, nausea and diarrhea. EMS reports that pt drinks wine daily and pain started in chest but is mostly in her adomen.    Allergies No Known Allergies  Level of Care/Admitting Diagnosis ED Disposition     ED Disposition  Admit   Condition  --   Comment  Hospital Area: [facility] [[id number]]  Level of Care: Med-Surg [16]  May place patient in observation at [facility] or [facility] if equivalent level of care is available:: No  Covid Evaluation: Asymptomatic - no recent exposure (last 10 days) testing not required  Diagnosis: Abdominal pain [161096]  Admitting Physician: Para March [[id number]  Attending Physician: Perley Jain, TODD D [[id number]]          B Medical/Surgery History Past Medical History:  Diagnosis Date   Anemia    Diabetes mellitus without complication (HCC)    Fibroid    Past Surgical History:  Procedure Laterality Date   TUBAL LIGATION       A IV Location/Drains/Wounds Patient Lines/Drains/Airways Status     Active Line/Drains/Airways     Name Placement date Placement time Site Days   Peripheral IV 04/21/23 18 G 1" Left Antecubital 04/21/23  0616  Antecubital  less than 1            Intake/Output Last 24 hours No intake or output data in the 24 hours ending 04/21/23 1422  Labs/Imaging Results for orders placed or performed during the hospital encounter of 04/21/23 (from the past 48 hour(s))  Lipase, blood     Status: None   Collection Time: 04/21/23  6:25 AM  Result Value Ref Range   Lipase 25 11 -  51 U/L    Comment: Performed at [facility], [address] [place], [place] [address]  Comprehensive metabolic panel     Status: Abnormal   Collection Time: 04/21/23  6:25 AM  Result Value Ref Range   Sodium 135 135 - 145 mmol/L   Potassium 2.9 (L) 3.5 - 5.1 mmol/L   Chloride 105 98 - 111 mmol/L   CO2 21 (L) 22 - 32 mmol/L   Glucose, Bld 210 (H) 70 - 99 mg/dL    Comment: Glucose reference range applies only to samples taken after fasting for at least 8 hours.   BUN 8 6 - 20 mg/dL   Creatinine, Ser 7.82 0.44 - 1.00 mg/dL   Calcium 8.0 (L) 8.9 - 10.3 mg/dL   Total Protein 6.8 6.5 - 8.1 g/dL   Albumin 3.3 (L) 3.5 - 5.0 g/dL   AST 89 (H) 15 - 41 U/L   ALT 49 (H) 0 - 44 U/L   Alkaline Phosphatase 76 38 - 126 U/L   Total Bilirubin 0.5 <1.2 mg/dL   GFR, Estimated >95 >62 mL/min    Comment: (NOTE) Calculated using the CKD-EPI Creatinine Equation (2021)    Anion gap 9 5 - 15    Comment: Performed at [facility], [address], [place], [place] [address]  CBC     Status: Abnormal   Collection  Time: 04/21/23  6:25 AM  Result Value Ref Range   WBC 12.3 (H) 4.0 - 10.5 K/uL   RBC 3.95 3.87 - 5.11 MIL/uL   Hemoglobin 12.0 12.0 - 15.0 g/dL   HCT 16.1 09.6 - 04.5 %   MCV 92.2 80.0 - 100.0 fL   MCH 30.4 26.0 - 34.0 pg   MCHC 33.0 30.0 - 36.0 g/dL   RDW 40.9 81.1 - 91.4 %   Platelets 346 150 - 400 K/uL   nRBC 0.0 0.0 - 0.2 %    Comment: Performed at [facility], [address], [place], [place] [address]  Ammonia     Status: None   Collection Time: 04/21/23  6:29 AM  Result Value Ref Range   Ammonia <10 9 - 35 umol/L    Comment: Performed at [facility], [address], [place], [place] [address]  hCG, serum, qualitative     Status: None   Collection Time: 04/21/23  7:36 AM  Result Value Ref Range   Preg, Serum NEGATIVE NEGATIVE    Comment:        THE SENSITIVITY OF THIS METHODOLOGY IS >10 mIU/mL. Performed at [facility], [address],  [place], [place] [address]   Troponin I (High Sensitivity)     Status: None   Collection Time: 04/21/23  7:36 AM  Result Value Ref Range   Troponin I (High Sensitivity) 3 <18 ng/L    Comment: (NOTE) Elevated high sensitivity troponin I (hsTnI) values and significant  changes across serial measurements may suggest ACS but many other  chronic and acute conditions are known to elevate hsTnI results.  Refer to the "Links" section for chest pain algorithms and additional  guidance. Performed at [facility], [address], [place], [place] [address]   Ethanol     Status: None   Collection Time: 04/21/23  7:36 AM  Result Value Ref Range   Alcohol, Ethyl (B) <10 <10 mg/dL    Comment: (NOTE) Lowest detectable limit for serum alcohol is 10 mg/dL.  For medical purposes only. Performed at [facility], [address] [place], [place] [address]   Troponin I (High Sensitivity)     Status: None   Collection Time: 04/21/23  9:30 AM  Result Value Ref Range   Troponin I (High Sensitivity) 3 <18 ng/L    Comment: (NOTE) Elevated high sensitivity troponin I (hsTnI) values and significant  changes across serial measurements may suggest ACS but many other  chronic and acute conditions are known to elevate hsTnI results.  Refer to the "Links" section for chest pain algorithms and additional  guidance. Performed at [facility], [address], [place], [place] [address]   Urinalysis, Routine w reflex microscopic -Urine, Clean Catch     Status: Abnormal   Collection Time: 04/21/23 11:33 AM  Result Value Ref Range   Color, Urine YELLOW YELLOW   APPearance HAZY (A) CLEAR   Specific Gravity, Urine >1.046 (H) 1.005 - 1.030   pH 5.0 5.0 - 8.0   Glucose, UA 50 (A) NEGATIVE mg/dL   Hgb urine dipstick LARGE (A) NEGATIVE   Bilirubin Urine NEGATIVE NEGATIVE   Ketones, ur 20 (A) NEGATIVE mg/dL   Protein, ur 010 (A) NEGATIVE mg/dL   Nitrite POSITIVE (A) NEGATIVE   Leukocytes,Ua NEGATIVE  NEGATIVE   RBC / HPF >50 0 - 5 RBC/hpf   WBC, UA 0-5 0 - 5 WBC/hpf  Bacteria, UA RARE (A) NONE SEEN   Squamous Epithelial / HPF 0-5 0 - 5 /HPF   Mucus PRESENT     Comment: Performed at [facility], [address], [place], [place] [address]  Rapid urine drug screen (hospital performed)     Status: Abnormal   Collection Time: 04/21/23 11:33 AM  Result Value Ref Range   Opiates POSITIVE (A) NONE DETECTED   Cocaine NONE DETECTED NONE DETECTED   Benzodiazepines NONE DETECTED NONE DETECTED   Amphetamines NONE DETECTED NONE DETECTED   Tetrahydrocannabinol NONE DETECTED NONE DETECTED   Barbiturates NONE DETECTED NONE DETECTED    Comment: (NOTE) DRUG SCREEN FOR MEDICAL PURPOSES ONLY.  IF CONFIRMATION IS NEEDED FOR ANY PURPOSE, NOTIFY LAB WITHIN 5 DAYS.  LOWEST DETECTABLE LIMITS FOR URINE DRUG SCREEN Drug Class                     Cutoff (ng/mL) Amphetamine and metabolites    1000 Barbiturate and metabolites    200 Benzodiazepine                 200 Opiates and metabolites        300 Cocaine and metabolites        300 THC                            50 Performed at [facility], [address], [place], [place] [address]    CT ABDOMEN PELVIS W CONTRAST  Result Date: 04/21/2023 CLINICAL DATA:  Acute generalized abdominal pain. EXAM: CT ABDOMEN AND PELVIS WITH CONTRAST TECHNIQUE: Multidetector CT imaging of the abdomen and pelvis was performed using the standard protocol following bolus administration of intravenous contrast. RADIATION DOSE REDUCTION: This exam was performed according to the departmental dose-optimization program which includes automated exposure control, adjustment of the mA and/or kV according to patient size and/or use of iterative reconstruction technique. CONTRAST:  75mL OMNIPAQUE IOHEXOL 350 MG/ML SOLN COMPARISON:  None Available. FINDINGS: Lower chest: Minimal bibasilar subsegmental atelectasis is noted with small left pleural effusion. Hepatobiliary:  No focal liver abnormality is seen. No gallstones, gallbladder wall thickening, or biliary dilatation. Pancreas: Unremarkable. No pancreatic ductal dilatation or surrounding inflammatory changes. Spleen: Normal in size without focal abnormality. Adrenals/Urinary Tract: Adrenal glands are unremarkable. Kidneys are normal, without renal calculi, focal lesion, or hydronephrosis. Bladder is unremarkable. Stomach/Bowel: Status post gastric bypass. The appendix appears normal. There is no evidence of bowel obstruction. Moderate wall thickening is seen involving small bowel loops in the left upper and right lower quadrants most consistent with enteritis. No definite colonic dilatation is noted. Vascular/Lymphatic: No significant vascular findings are present. No enlarged abdominal or pelvic lymph nodes. Reproductive: Uterus and bilateral adnexa are unremarkable. Other: Mild ascites is noted in the pelvis and around the liver and spleen. Musculoskeletal: No acute or significant osseous findings. IMPRESSION: Moderate small bowel wall thickening is noted in the left upper and right lower quadrants most consistent with enteritis. Mild ascites is noted. Status post gastric bypass. Minimal bibasilar subsegmental atelectasis is noted with small left pleural effusion. Electronically Signed   By: Lupita Raider M.D.   On: 04/21/2023 12:34    Pending Labs Unresulted Labs (From admission, onward)     Start     Ordered   04/22/23 0500  Comprehensive metabolic panel  Tomorrow morning,   R        04/21/23 1415  04/22/23 0500  CBC  Tomorrow morning,   R        04/21/23 1415   04/22/23 0500  Hepatitis panel, acute  Tomorrow morning,   R        04/21/23 1415   04/21/23 1410  Calcium  Add-on,   AD        04/21/23 1415   04/21/23 1408  Hemoglobin A1c  Once,   R        04/21/23 1415   04/21/23 1406  HIV Antibody (routine testing w rflx)  (HIV Antibody (Routine testing w reflex) panel)  Once,   R        04/21/23 1415    04/21/23 1305  Urine Culture  Add-on,   AD       Question:  Indication  Answer:  Acute gross hematuria   04/21/23 1305            Vitals/Pain Today's Vitals   04/21/23 1300 04/21/23 1330 04/21/23 1355 04/21/23 1400  BP: 116/67 (!) 125/57  (!) 133/50  Pulse: 96 95  (!) 101  Resp: 20 (!) 22  (!) 21  Temp:   98.2 F (36.8 C)   TempSrc:      SpO2: 98% 98%  99%  Weight:      Height:      PainSc:        Isolation Precautions No active isolations  Medications Medications  cefTRIAXone (ROCEPHIN) 1 g in sodium chloride 0.9 % 100 mL IVPB (1 g Intravenous New Bag/Given 04/21/23 1419)  enoxaparin (LOVENOX) injection 40 mg (has no administration in time range)  HYDROmorphone (DILAUDID) injection 1 mg (has no administration in time range)  ketorolac (TORADOL) 15 MG/ML injection 15 mg (has no administration in time range)  pantoprazole (PROTONIX) injection 40 mg (has no administration in time range)  ondansetron (ZOFRAN) injection 4 mg (4 mg Intravenous Given 04/21/23 0658)  HYDROmorphone (DILAUDID) injection 1 mg (1 mg Intravenous Given 04/21/23 0700)  potassium chloride SA (KLOR-CON M) CR tablet 40 mEq (40 mEq Oral Given 04/21/23 1023)  HYDROmorphone (DILAUDID) injection 1 mg (1 mg Intravenous Given 04/21/23 0925)  iohexol (OMNIPAQUE) 350 MG/ML injection 75 mL (75 mLs Intravenous Contrast Given 04/21/23 1022)  morphine (PF) 4 MG/ML injection 4 mg (4 mg Intravenous Given 04/21/23 1212)    Mobility walks     Focused Assessments     R Recommendations: See Admitting Provider Note  Report given to:   Additional Notes:

## 2023-04-22 ENCOUNTER — Encounter (HOSPITAL_COMMUNITY): Payer: Self-pay | Admitting: Family Medicine

## 2023-04-22 DIAGNOSIS — R059 Cough, unspecified: Secondary | ICD-10-CM | POA: Diagnosis not present

## 2023-04-22 DIAGNOSIS — N390 Urinary tract infection, site not specified: Secondary | ICD-10-CM | POA: Insufficient documentation

## 2023-04-22 DIAGNOSIS — K219 Gastro-esophageal reflux disease without esophagitis: Secondary | ICD-10-CM | POA: Diagnosis present

## 2023-04-22 DIAGNOSIS — R7303 Prediabetes: Secondary | ICD-10-CM | POA: Diagnosis present

## 2023-04-22 DIAGNOSIS — E559 Vitamin D deficiency, unspecified: Secondary | ICD-10-CM | POA: Diagnosis present

## 2023-04-22 DIAGNOSIS — Z9884 Bariatric surgery status: Secondary | ICD-10-CM | POA: Diagnosis not present

## 2023-04-22 DIAGNOSIS — K529 Noninfective gastroenteritis and colitis, unspecified: Secondary | ICD-10-CM | POA: Diagnosis present

## 2023-04-22 DIAGNOSIS — E876 Hypokalemia: Secondary | ICD-10-CM | POA: Diagnosis not present

## 2023-04-22 DIAGNOSIS — Z79899 Other long term (current) drug therapy: Secondary | ICD-10-CM | POA: Diagnosis not present

## 2023-04-22 DIAGNOSIS — E785 Hyperlipidemia, unspecified: Secondary | ICD-10-CM | POA: Diagnosis present

## 2023-04-22 DIAGNOSIS — J189 Pneumonia, unspecified organism: Secondary | ICD-10-CM | POA: Diagnosis not present

## 2023-04-22 DIAGNOSIS — R188 Other ascites: Secondary | ICD-10-CM | POA: Diagnosis not present

## 2023-04-22 DIAGNOSIS — R1084 Generalized abdominal pain: Secondary | ICD-10-CM

## 2023-04-22 DIAGNOSIS — E860 Dehydration: Secondary | ICD-10-CM | POA: Diagnosis present

## 2023-04-22 DIAGNOSIS — R109 Unspecified abdominal pain: Secondary | ICD-10-CM | POA: Diagnosis not present

## 2023-04-22 DIAGNOSIS — Z87440 Personal history of urinary (tract) infections: Secondary | ICD-10-CM | POA: Diagnosis not present

## 2023-04-22 LAB — CBC
HCT: 33.9 % — ABNORMAL LOW (ref 36.0–46.0)
Hemoglobin: 11 g/dL — ABNORMAL LOW (ref 12.0–15.0)
MCH: 29.7 pg (ref 26.0–34.0)
MCHC: 32.4 g/dL (ref 30.0–36.0)
MCV: 91.6 fL (ref 80.0–100.0)
Platelets: 269 10*3/uL (ref 150–400)
RBC: 3.7 MIL/uL — ABNORMAL LOW (ref 3.87–5.11)
RDW: 13.4 % (ref 11.5–15.5)
WBC: 7.5 10*3/uL (ref 4.0–10.5)
nRBC: 0 % (ref 0.0–0.2)

## 2023-04-22 LAB — COMPREHENSIVE METABOLIC PANEL
ALT: 36 U/L (ref 0–44)
AST: 51 U/L — ABNORMAL HIGH (ref 15–41)
Albumin: 2.9 g/dL — ABNORMAL LOW (ref 3.5–5.0)
Alkaline Phosphatase: 60 U/L (ref 38–126)
Anion gap: 6 (ref 5–15)
BUN: 9 mg/dL (ref 6–20)
CO2: 22 mmol/L (ref 22–32)
Calcium: 8 mg/dL — ABNORMAL LOW (ref 8.9–10.3)
Chloride: 105 mmol/L (ref 98–111)
Creatinine, Ser: 0.67 mg/dL (ref 0.44–1.00)
GFR, Estimated: >60 mL/min (ref >60)
Glucose, Bld: 100 mg/dL — ABNORMAL HIGH (ref 70–99)
Potassium: 4 mmol/L (ref 3.5–5.1)
Sodium: 133 mmol/L — ABNORMAL LOW (ref 135–145)
Total Bilirubin: 1 mg/dL (ref <1.2)
Total Protein: 6.4 g/dL — ABNORMAL LOW (ref 6.5–8.1)

## 2023-04-22 LAB — HEPATITIS PANEL, ACUTE
HCV Ab: NONREACTIVE
Hep A IgM: NONREACTIVE
Hep B C IgM: NONREACTIVE
Hepatitis B Surface Ag: NONREACTIVE

## 2023-04-22 LAB — MAGNESIUM: Magnesium: 2 mg/dL (ref 1.7–2.4)

## 2023-04-22 LAB — LACTIC ACID, PLASMA: Lactic Acid, Venous: 1.1 mmol/L (ref 0.5–1.9)

## 2023-04-22 MED ORDER — POLYETHYLENE GLYCOL 3350 17 G PO PACK
17.0000 g | PACK | Freq: Every day | ORAL | Status: DC | PRN
  Administered 2023-04-22 – 2023-04-23 (×2): 17 g via ORAL
  Filled 2023-04-22 (×3): qty 1

## 2023-04-22 MED ORDER — PANTOPRAZOLE SODIUM 40 MG PO TBEC
40.0000 mg | DELAYED_RELEASE_TABLET | Freq: Every day | ORAL | Status: DC
  Administered 2023-04-22 – 2023-04-24 (×3): 40 mg via ORAL
  Filled 2023-04-22 (×3): qty 1

## 2023-04-22 MED ORDER — SODIUM CHLORIDE 0.9 % IV SOLN
1.0000 g | INTRAVENOUS | Status: AC
  Administered 2023-04-22 – 2023-04-23 (×2): 1 g via INTRAVENOUS
  Filled 2023-04-22 (×3): qty 10

## 2023-04-22 MED ORDER — HYDROMORPHONE HCL 2 MG PO TABS
1.0000 mg | ORAL_TABLET | ORAL | Status: DC | PRN
  Administered 2023-04-22 – 2023-04-23 (×3): 1 mg via ORAL
  Filled 2023-04-22 (×3): qty 1

## 2023-04-22 MED ORDER — ACETAMINOPHEN 325 MG PO TABS
650.0000 mg | ORAL_TABLET | Freq: Four times a day (QID) | ORAL | Status: DC
  Administered 2023-04-22 – 2023-04-24 (×8): 650 mg via ORAL
  Filled 2023-04-22 (×8): qty 2

## 2023-04-22 MED ORDER — SENNA 8.6 MG PO TABS
1.0000 | ORAL_TABLET | Freq: Every day | ORAL | Status: DC | PRN
  Administered 2023-04-22 – 2023-04-23 (×2): 8.6 mg via ORAL
  Filled 2023-04-22 (×2): qty 1

## 2023-04-22 MED ORDER — PANTOPRAZOLE SODIUM 40 MG IV SOLR: 40.0000 mg | Freq: Every day | INTRAVENOUS | Status: DC

## 2023-04-22 MED ORDER — SENNA 8.6 MG PO TABS: 1.0000 | ORAL_TABLET | Freq: Every day | ORAL | Status: DC | PRN

## 2023-04-22 MED ORDER — ALUM & MAG HYDROXIDE-SIMETH 200-200-20 MG/5ML PO SUSP
30.0000 mL | ORAL | Status: DC | PRN
  Administered 2023-04-22 – 2023-04-24 (×4): 30 mL via ORAL
  Filled 2023-04-22 (×4): qty 30

## 2023-04-22 MED ORDER — HYDROMORPHONE HCL 1 MG/ML PO LIQD: 1.0000 mg | ORAL | Status: DC | PRN

## 2023-04-22 NOTE — Progress Notes (Signed)
Daily Progress Note Intern Pager: [phone]  Patient name: Alexandra Patrick Medical record number: [id number] Date of birth: Oct 04, 1965 Age: 57 y.o. Gender: female  Primary Care Provider: [facility]: none Code Status: Full  Pt Overview and Major Events to Date:  04/21/23 - admitted  Assessment and Plan:  57 y.o. female with PMHx DM, GERD, gastric bypass presenting with abdominal pain, nausea, and vomiting.  Assessment & Plan Abdominal pain Generalized abdominal pain that started in her chest suddenly with associated N/V.  Troponin is negative. EKG unremarkable.  Mild leukocytosis at 12.3 initially but normalized today.  Mildly elevated liver enzymes (AST/ALT 89,49 respectively) initially that improved this morning. Hepatitis panel negative. Ammonia negative.    Pregnancy test negative. ETOH less than 10.  CT abdomen pelvis with contrast with moderate small bowel wall thickening, mild ascites, minimal bibasilar atelectasis with small left pleural effusion. RUQ [place] with ascites but no cholelithiasis or acute cholecystitis.  - Tylenol 650mg  q6h for mild to moderate pain - Dilaudid 1 mg PO every 3 hours as needed for severe pain - Protonix 40 mg PO daily - CIWAs 0>3 - Lactate pending - Senna, MiraLAX as needed for constipation - Folate, multivitamin, thiamine - GI consulted, appreciate recommendations - AM CMP, CBC Urinary tract infection Reports intermittent back pain over the last few days, states that she felt a UTI coming on however no urinary symptoms.  UA consistent with UTI, greater than 50 RBCs.  S/p Rocephin in the ED. - Urine culture pending - reach out to lab to add on - Continue Rocephin 1g daily - Consider renal ultrasound if suspicious for kidney stone increases Hypokalemia (Resolved: 04/22/2023) Secondary to GI losses. Potassium 2.9 in ED, repleted with and now resolved.  - AM CMP   FEN/GI: regular PPx:  lovenox Dispo:Home pending clinical improvement .   Subjective:  No acute events overnight.  Patient states that she tried to eat but was unable to due to nausea and abdominal pain.  She is able to tolerate fluids.  No new vomiting or diarrhea but her abdominal pain has persisted.  Still worse in her upper abdomen and around her umbilicus.  Objective: Temp:  [98.2 F (36.8 C)-99.3 F (37.4 C)] 98.7 F (37.1 C) (11/19 0454) Pulse Rate:  [71-101] 71 (11/19 0454) Resp:  [16-22] 18 (11/19 0454) BP: (101-133)/(50-73) 115/73 (11/19 0454) SpO2:  [97 %-99 %] 99 % (11/19 0454) Physical Exam: General: Mild distress secondary to pain Respiratory: No increased work of breathing on room air Abdomen: Diffusely tender to palpation, worse in epigastric region, patient still feels distended and but I do not feel much distention on my exam  Laboratory: Most recent CBC Lab Results  Component Value Date   WBC 7.5 04/22/2023   HGB 11.0 (L) 04/22/2023   HCT 33.9 (L) 04/22/2023   MCV 91.6 04/22/2023   PLT 269 04/22/2023   Most recent BMP    Latest Ref Rng & Units 04/22/2023    7:11 AM  BMP  Glucose 70 - 99 mg/dL 846   BUN 6 - 20 mg/dL 9   Creatinine 9.62 - 9.52 mg/dL 8.41   Sodium 324 - 401 mmol/L 133   Potassium 3.5 - 5.1 mmol/L 4.0   Chloride 98 - 111 mmol/L 105   CO2 22 - 32 mmol/L 22   Calcium 8.9 - 10.3 mg/dL 8.0    Hgb U2V 5.7 Ca 8.3  Imaging/Diagnostic Tests: US Abdomen RUQ  04/21/23 IMPRESSION: 1. No cholelithiasis or sonographic evidence for acute cholecystitis. 2. Ascites.  CT A/P W Contrast 04/21/23 IMPRESSION: Moderate small bowel wall thickening is noted in the left upper and right lower quadrants most consistent with enteritis. Mild ascites is noted.   Status post gastric bypass.   Minimal bibasilar subsegmental atelectasis is noted with small left pleural effusion.    Liset Mcmonigle, DO 04/22/2023, 12:56 PM  PGY-1, [facility] FPTS  Intern pager: [phone], text pages welcome Secure chat group [facility] Teaching Service

## 2023-04-22 NOTE — Assessment & Plan Note (Addendum)
Secondary to GI losses. Potassium 2.9 in ED, repleted with and now resolved.  - AM CMP

## 2023-04-22 NOTE — Assessment & Plan Note (Addendum)
Reports intermittent back pain over the last few days, states that she felt a UTI coming on however no urinary symptoms.  UA consistent with UTI, greater than 50 RBCs.  S/p Rocephin in the ED. - Urine culture pending - reach out to lab to add on - Continue Rocephin 1g daily - Consider renal ultrasound if suspicious for kidney stone increases

## 2023-04-22 NOTE — Assessment & Plan Note (Addendum)
Generalized abdominal pain that started in her chest suddenly with associated N/V.  Troponin is negative. EKG unremarkable.  Mild leukocytosis at 12.3 initially but normalized today.  Mildly elevated liver enzymes (AST/ALT 89,49 respectively) initially that improved this morning. Hepatitis panel negative. Ammonia negative.    Pregnancy test negative. ETOH less than 10.  CT abdomen pelvis with contrast with moderate small bowel wall thickening, mild ascites, minimal bibasilar atelectasis with small left pleural effusion. RUQ [place] with ascites but no cholelithiasis or acute cholecystitis.  - Tylenol 650mg  q6h for mild to moderate pain - Dilaudid 1 mg PO every 3 hours as needed for severe pain - Protonix 40 mg PO daily - CIWAs 0>3 - Lactate pending - Senna, MiraLAX as needed for constipation - Folate, multivitamin, thiamine - GI consulted, appreciate recommendations - AM CMP, CBC

## 2023-04-22 NOTE — Plan of Care (Signed)
  Problem: Clinical Measurements: Goal: Will remain free from infection Outcome: Progressing Goal: Respiratory complications will improve Outcome: Progressing Goal: Cardiovascular complication will be avoided Outcome: Progressing   Problem: Activity: Goal: Risk for activity intolerance will decrease Outcome: Progressing   Problem: Safety: Goal: Ability to remain free from injury will improve Outcome: Progressing

## 2023-04-22 NOTE — Assessment & Plan Note (Signed)
Reports intermittent back pain over the last few days, states that she felt a UTI coming on however no urinary symptoms.  UA consistent with UTI, greater than 50 RBCs.  S/p Rocephin in the ED. - Urine culture pending - Consider renal ultrasound if suspicious for kidney stone increases

## 2023-04-23 ENCOUNTER — Inpatient Hospital Stay (HOSPITAL_COMMUNITY): Payer: Medicaid Other

## 2023-04-23 DIAGNOSIS — R1084 Generalized abdominal pain: Secondary | ICD-10-CM | POA: Diagnosis not present

## 2023-04-23 LAB — URINE CULTURE

## 2023-04-23 LAB — COMPREHENSIVE METABOLIC PANEL
ALT: 29 U/L (ref 0–44)
AST: 39 U/L (ref 15–41)
Albumin: 2.7 g/dL — ABNORMAL LOW (ref 3.5–5.0)
Alkaline Phosphatase: 60 U/L (ref 38–126)
Anion gap: 7 (ref 5–15)
BUN: <5 mg/dL — ABNORMAL LOW (ref 6–20)
CO2: 25 mmol/L (ref 22–32)
Calcium: 8.2 mg/dL — ABNORMAL LOW (ref 8.9–10.3)
Chloride: 102 mmol/L (ref 98–111)
Creatinine, Ser: 0.63 mg/dL (ref 0.44–1.00)
GFR, Estimated: >60 mL/min (ref >60)
Glucose, Bld: 108 mg/dL — ABNORMAL HIGH (ref 70–99)
Potassium: 4.2 mmol/L (ref 3.5–5.1)
Sodium: 134 mmol/L — ABNORMAL LOW (ref 135–145)
Total Bilirubin: 1.3 mg/dL — ABNORMAL HIGH (ref <1.2)
Total Protein: 6.4 g/dL — ABNORMAL LOW (ref 6.5–8.1)

## 2023-04-23 LAB — CBC
HCT: 32.5 % — ABNORMAL LOW (ref 36.0–46.0)
Hemoglobin: 10.8 g/dL — ABNORMAL LOW (ref 12.0–15.0)
MCH: 30.7 pg (ref 26.0–34.0)
MCHC: 33.2 g/dL (ref 30.0–36.0)
MCV: 92.3 fL (ref 80.0–100.0)
Platelets: 256 10*3/uL (ref 150–400)
RBC: 3.52 MIL/uL — ABNORMAL LOW (ref 3.87–5.11)
RDW: 13.6 % (ref 11.5–15.5)
WBC: 11 10*3/uL — ABNORMAL HIGH (ref 4.0–10.5)
nRBC: 0 % (ref 0.0–0.2)

## 2023-04-23 MED ORDER — CEFTRIAXONE SODIUM 1 G IJ SOLR
1.0000 g | INTRAMUSCULAR | Status: DC
  Administered 2023-04-24: 1 g via INTRAVENOUS
  Filled 2023-04-23: qty 10

## 2023-04-23 MED ORDER — HYDROMORPHONE HCL 2 MG PO TABS
2.0000 mg | ORAL_TABLET | ORAL | Status: DC | PRN
  Administered 2023-04-23 (×2): 2 mg via ORAL
  Filled 2023-04-23 (×2): qty 1

## 2023-04-23 MED ORDER — SODIUM CHLORIDE 0.9 % IV SOLN
500.0000 mg | INTRAVENOUS | Status: DC
  Administered 2023-04-24: 500 mg via INTRAVENOUS
  Filled 2023-04-23: qty 5

## 2023-04-23 MED ORDER — PROCHLORPERAZINE EDISYLATE 10 MG/2ML IJ SOLN
10.0000 mg | Freq: Four times a day (QID) | INTRAMUSCULAR | Status: DC | PRN
  Administered 2023-04-24: 10 mg via INTRAVENOUS
  Filled 2023-04-23: qty 2

## 2023-04-23 MED ORDER — HYDROMORPHONE HCL 2 MG PO TABS
1.0000 mg | ORAL_TABLET | Freq: Once | ORAL | Status: AC
  Administered 2023-04-23: 1 mg via ORAL
  Filled 2023-04-23: qty 1

## 2023-04-23 MED ORDER — HYDROMORPHONE HCL 2 MG PO TABS
1.0000 mg | ORAL_TABLET | Freq: Four times a day (QID) | ORAL | Status: DC | PRN
  Administered 2023-04-23: 1 mg via ORAL
  Filled 2023-04-23: qty 1

## 2023-04-23 MED ORDER — LIDOCAINE 5 % EX PTCH
1.0000 | MEDICATED_PATCH | CUTANEOUS | Status: DC
  Administered 2023-04-24: 1 via TRANSDERMAL
  Filled 2023-04-23: qty 1

## 2023-04-23 MED ORDER — IBUPROFEN 200 MG PO TABS
600.0000 mg | ORAL_TABLET | Freq: Four times a day (QID) | ORAL | Status: DC | PRN
  Administered 2023-04-24: 600 mg via ORAL
  Filled 2023-04-23: qty 3

## 2023-04-23 MED ORDER — FLUCONAZOLE 150 MG PO TABS
150.0000 mg | ORAL_TABLET | Freq: Once | ORAL | Status: AC
  Administered 2023-04-23: 150 mg via ORAL
  Filled 2023-04-23: qty 1

## 2023-04-23 MED ORDER — BENZONATATE 100 MG PO CAPS
100.0000 mg | ORAL_CAPSULE | Freq: Three times a day (TID) | ORAL | Status: DC | PRN
  Administered 2023-04-24: 100 mg via ORAL
  Filled 2023-04-23: qty 1

## 2023-04-23 NOTE — Assessment & Plan Note (Addendum)
Intermittent back pain with no urinary sx. UA consistent with UTI.  S/p Rocephin in the ED. - Urine culture with multiple species present - Continue Rocephin 1g daily, last dose today - Diflucan 150 mg x 1 due to suspected yeast infection per patient

## 2023-04-23 NOTE — Plan of Care (Signed)
FMTS Brief Progress Note  S:To bedside for report of fever to 100.6 and worsening cough. She tells me that her cough has been worsening over the course of the day. She also has some left sided shoulder pain that has been worsening over the course of the day. This is located in the upper trapezius and is tender to the touch.    O: BP 114/67 (BP Location: Left Arm)   Pulse 100   Temp 100.2 F (37.9 C) (Oral)   Resp 18   Ht 5\' 7"  (1.702 m)   Wt 84.4 kg   SpO2 98%   BMI 29.14 kg/m   Gen: Tired appearing but NAD HENT: Without cervical LAD Cardio: RRR, no murmur Pulm: Normal WOB on RA, slightly diminished in the bilateral bases  Abd: Minimally tender, no rebound or guarding  MSK: Left trapezius muscle belly is tender to palpation without obvious muscle spasm  A/P: Cough and Fever Reviewed prior imaging, CT of the abd/pelv obtained on admission showed L pleural effusion raising my suspicion for a pneumonia that may have been present on admission. It is certainly possible that a lower lobe PNA could be driving her abdominal pain.  CXR obtained and reviewed, shows LLL consolidation with associated pleural effusion (awaiting formal read by radiology). Given that we have reason to believe this may have been present on admission, favor treating at [facility].  VSS and breathing comfortably on room air.  - She has already been receiving CTX for UTI, will extend course for PNA coverage - Adding Azithromycin  - Tessalon perles for symptomatic cough  - Ibuprofen added for fever, already on scheduled tylenol  - Shoulder pain likely  MSK in etiology given tenderness to palpation of the trapezius muscle belly, already getting Tylenol and ibuprofen, will add lidocaine patch   - Orders reviewed. Labs for AM ordered, which was adjusted as needed.  - If condition changes, plan includes escalation of abx and cross  sectional imaging to better characterize effusion.   Alicia Amel, MD 04/23/2023, 10:56  PM PGY-3, [facility] Night Resident  Please page [phone] with questions.

## 2023-04-23 NOTE — Plan of Care (Signed)

## 2023-04-23 NOTE — Discharge Summary (Shared)
Family Medicine Teaching [facility] Discharge Summary  Patient name: Alexandra Patrick Medical record number: [id number] Date of birth: 30-Mar-1966 Age: 57 y.o. Gender: female Date of Admission: 04/21/2023  Date of Discharge: *** Admitting Physician: Para March, DO  Primary Care Provider: [facility], [facility] Consultants: None  Indication for Hospitalization: Abdominal pain, nausea vomiting  Discharge Diagnoses/Problem List:  Principal Problem for Admission: Abdominal pain Other Problems addressed during stay:  Principal Problem:   Abdominal pain Active Problems:   Dehydration   Nausea and vomiting   Enteritis   Urinary tract infection    Brief Hospital Course:  Alexandra Patrick is a 57 y.o.female with a history of T2DM, GERD, Roux-en-Y gastric bypass who was admitted to the family medicine teaching Service at [facility] for abdominal pain. Her hospital course is detailed below:  Generalized abdominal pain Generalized abdominal pain that started in her chest suddenly with associated N/V.  Troponin is negative. EKG unremarkable.  Mild leukocytosis.  Mildly elevated liver enzymes.  Ammonia negative.    Pregnancy test negative. ETOH less than 10.  CT abdomen pelvis with contrast with moderate small bowel wall thickening, mild ascites, minimal bibasilar atelectasis with small left pleural effusion.  Likely gastroenteritis versus severe GERD.  RUQ ultrasound showed ascites but negative for cholelithiasis or acute cholecystitis.  Started PPI and pain management. Pain improved by discharge.   UTI with hematuria Reports recent back pain which she associates with her UTIs in the past.  Some suspicion for nephrolithiasis given hematuria and back pain.  No dysuria or polyuria.  UA consistent with UTI.  Urine culture with multiple species.  Completed course of rocephin. Received diflucan 150mg  x1 for potential yeast infection.   Hypokalemia Initially hypokalemic  secondary to GI losses.  Repleted and normalized by discharge.   PCP Follow-up Recommendations: Ascites on RUQ [place] Pt plans to stop drinking wine when she leaves, f/u on this Started PPI in hospital, re-evaluate Ensure f/u with bariatric surgeon   Disposition: Home  Discharge Condition: Stable  Discharge Exam:  Vitals:   04/23/23 0534 04/23/23 0747  BP: 111/67 117/72  Pulse: 82 87  Resp: 17   Temp: 98.6 F (37 C) 99 F (37.2 C)  SpO2: 97% 96%   Physical Exam: General: No acute distress, appears much more comfortable than the last few days Respiratory: No increased work of breathing on room air Abdomen: Soft, mildly distended, bowel sounds present, much less tender than on previous exam  Significant Procedures: None  Significant Labs and Imaging:  Recent Labs  Lab 04/22/23 0711 04/23/23 0625  WBC 7.5 11.0*  HGB 11.0* 10.8*  HCT 33.9* 32.5*  PLT 269 256   Recent Labs  Lab 04/21/23 1535 04/22/23 0711 04/23/23 0625  NA  --  133* 134*  K  --  4.0 4.2  CL  --  105 102  CO2  --  22 25  GLUCOSE  --  100* 108*  BUN  --  9 <5*  CREATININE  --  0.67 0.63  CALCIUM 8.3* 8.0* 8.2*  MG  --  2.0  --   ALKPHOS  --  60 60  AST  --  51* 39  ALT  --  36 29  ALBUMIN  --  2.9* 2.7*   US Abdomen RUQ 04/21/23 IMPRESSION: 1. No cholelithiasis or sonographic evidence for acute cholecystitis. 2. Ascites.   CT A/P W Contrast 04/21/23 IMPRESSION: Moderate small bowel wall thickening is noted in the left  upper and right lower quadrants most consistent with enteritis. Mild ascites is noted.   Status post gastric bypass.   Minimal bibasilar subsegmental atelectasis is noted with small left pleural effusion.  Results/Tests Pending at Time of Discharge: None  Discharge Medications:  Allergies as of 04/23/2023   No Known Allergies   Med Rec must be completed prior to using this [facility]***       Discharge Instructions: Please refer to Patient Instructions  section of EMR for full details.  Patient was counseled important signs and symptoms that should prompt return to medical care, changes in medications, dietary instructions, activity restrictions, and follow up appointments.   Follow-Up Appointments:   Para March, DO 04/23/2023, 12:25 PM PGY-1, [facility] Family [facility]

## 2023-04-23 NOTE — TOC Initial Note (Signed)
Transition of Care [facility]) - Initial/Assessment Note    Patient Details  Name: Alexandra Patrick MRN: [id number] Date of Birth: 1965-08-28  [facility]) CM/SW Contact:    Marliss Coots, LCSW Phone Number: 04/23/2023, 2:45 PM  Clinical Narrative:                  This CSW introduced herself and role to patient at bedside. CSW inquired about SDOH (utilities). Patient stated that she informed bedside RN of the threat of losing utilities as she is self-employed as a Interior and spatial designer and finances depend on her and clientele appointment availability. Patient informed this CSW that she is currently in the process of applying to [employer], [employer], and a utility assistance application through one other program she did not remember the name of at the time. She stated that she plans to complete these applications upon discharge. CSW offered packet of [facility] [employer] (CSW S-drive), which patient accepted.  Expected Discharge Plan: Home/Self Care Barriers to Discharge: Continued Medical Work up   Patient Goals and CMS Choice Patient states their goals for this hospitalization and ongoing recovery are:: to go home          Expected Discharge Plan and Services In-house Referral: Clinical Social Work     Living arrangements for the past 2 months: Apartment                                      Prior Living Arrangements/Services Living arrangements for the past 2 months: Apartment Lives with:: Self Patient language and need for interpreter reviewed:: Yes Do you feel safe going back to the place where you live?: Yes      Need for Family Participation in Patient Care: Yes (Comment) Care giver support system in place?: No (comment)   Criminal Activity/Legal Involvement Pertinent to Current Situation/Hospitalization: No - Comment as needed  Activities of Daily Living   ADL Screening (condition at time of admission) Independently  performs ADLs?: Yes (appropriate for developmental age) Is the patient deaf or have difficulty hearing?: No Does the patient have difficulty seeing, even when wearing glasses/contacts?: No Does the patient have difficulty concentrating, remembering, or making decisions?: No  Permission Sought/Granted Permission sought to share information with : Family Supports Permission granted to share information with : Yes, Verbal Permission Granted  Share Information with NAME: Eura Micco     Permission granted to share info w Relationship: Daughter  Permission granted to share info w Contact Information: [phone]  Emotional Assessment Appearance:: Appears stated age Attitude/Demeanor/Rapport: Engaged Affect (typically observed): Accepting, Calm, Adaptable, Stable, Pleasant, Appropriate Orientation: : Oriented to Self, Oriented to Place, Oriented to  Time, Oriented to Situation Alcohol / Substance Use: Not Applicable Psych Involvement: No (comment)  Admission diagnosis:  Hypokalemia [E87.6] Generalized abdominal pain [R10.84] Abdominal pain [R10.9] Urinary tract infection with hematuria, site unspecified [N39.0, R31.9] Patient Active Problem List   Diagnosis Date Noted   Urinary tract infection 04/22/2023   Abdominal pain 04/21/2023   Dehydration 04/21/2023   Nausea and vomiting 04/21/2023   Enteritis 04/21/2023   Acute upper respiratory infection 03/12/2010   URINALYSIS, ABNORMAL 03/08/2008   GERD 08/26/2007   COUGH 08/25/2007   DYSMENORRHEA 02/19/2007   FIBROIDS, UTERUS 02/15/2007   CARPAL TUNNEL SYNDROME, BILATERAL 02/15/2007   MENORRHAGIA 02/15/2007   DEPENDENT EDEMA, LEGS 02/15/2007   DIABETES MELLITUS, TYPE II, CONTROLLED  04/10/2006   DYSLIPIDEMIA 04/10/2006   PCP:  [facility]:   [facility] [address] ([address] [place] - [address] [place] ([place] [address] Phone: [phone] Fax:  [phone]  [facility] [address] [place] [address] Phone: [phone] Fax: [phone]     Social Determinants of Health (SDOH) Social History: SDOH Screenings   Food Insecurity: No Food Insecurity (04/21/2023)  Housing: Low Risk  (04/21/2023)  Transportation Needs: No Transportation Needs (04/21/2023)  Utilities: At Risk (04/21/2023)  Social Connections: Unknown (10/13/2021)   Received from [facility], [facility]  Tobacco Use: Medium Risk (06/28/2021)   Received from [facility], [facility]   SDOH Interventions:     Readmission Risk Interventions     No data to display

## 2023-04-23 NOTE — Progress Notes (Signed)
Over the course of night shift, pt complained of indigestion and acid reflux, requesting Maalox again. Dr. Marisue Humble and Dr. Gilman Buttner notified. Maalox ordered and given. Pt complained current pain regimen not working, pain in abdomen (upper and lower) 8/10, pain will decrease to 5-6 but will increase back to 8. Pt also requested that GI Dr or Bariatric Dr see her and would like a MRI. Doctors notified per above. Pain regimen adjusted and doctors will follow up with dayteam doctors.

## 2023-04-23 NOTE — Assessment & Plan Note (Addendum)
Workup overall unremarkable. Consulted GI who reached out via Secure Chat (Dr. Barron Alvine) and they suspect infectious enteritis with reactive ascites and edema based off workup, recommended supportive care. WBC slightly increased to 11, total bili 1.3 this morning. Liver enzymes normalized.  - Tylenol 650mg  q6h for mild to moderate pain - Spacing dilaudid 1mg  PO out to q6h prior to potential d/c - Protonix 40 mg PO daily - Last CIWA 2 - Senna, MiraLAX as needed for constipation - Folate, multivitamin, thiamine

## 2023-04-23 NOTE — Discharge Instructions (Signed)
Dear Alexandra Patrick,   Thank you for letting us participate in your care! You were admitted to the hospital for abdominal pain and nausea, vomiting. We think you acquired a viral gastroenteritis. Please follow up with your bariatric surgeon and PCP in the next week.   POST-HOSPITAL & CARE INSTRUCTIONS We recommend following up with your PCP within 1 week from being discharged from the hospital. Please let PCP/Specialists know of any changes in medications that were made which you will be able to see in the medications section of this packet.  DOCTOR'S APPOINTMENTS & FOLLOW UP No future appointments.   Thank you for choosing [facility]! Take care and be well!  Family Medicine Teaching Service Inpatient Team [place]  [facility]  [address] [place], [place] [address] [phone]

## 2023-04-23 NOTE — Progress Notes (Signed)
   04/23/23 1112  Mobility  Activity Ambulated independently in hallway  Level of Assistance Independent  Assistive Device None  Distance Ambulated (ft) 2000 ft  Activity Response Tolerated fair  Mobility Referral Yes  $Mobility charge 1 Mobility  Mobility Specialist Start Time (ACUTE ONLY) 1038  Mobility Specialist Stop Time (ACUTE ONLY) 1112  Mobility Specialist Time Calculation (min) (ACUTE ONLY) 34 min   Mobility Specialist: Progress Note  During Mobility: HR 110-117, SpO2 92-96% RA  Pt agreeable to mobility session - requested by RN - received in bed. Ambulated ind with no AD. C/o abd soreness d/t gas episode. Returned to EOB with all needs met - call bell within reach.  Pt was very eager to walk, lives an active lifestyle, stated walking "feels good."  Barnie Mort, BS Mobility Specialist Please contact via SecureChat or Rehab office at [phone].

## 2023-04-23 NOTE — Progress Notes (Signed)
     Daily Progress Note Intern Pager: [phone]  Patient name: Alexandra Patrick Medical record number: [id number] Date of birth: [birth year] Age: 57 y.o. Gender: female  Primary Care Provider: [facility] Consultants: none  Code Status: Full   Pt Overview and Major Events to Date:  04/21/23 - admitted   Assessment and Plan:  57 y.o. female with PMHx DM, GERD, gastric bypass presenting with abdominal pain, nausea, and vomiting. Overall has improved with overall unremarkable workup. Likely discharge later today. Assessment & Plan Abdominal pain Workup overall unremarkable. Consulted GI who reached out via Secure Chat (Dr. Barron Alvine) and they suspect infectious enteritis with reactive ascites and edema based off workup, recommended supportive care. WBC slightly increased to 11, total bili 1.3 this morning. Liver enzymes normalized.  - Tylenol 650mg  q6h for mild to moderate pain - Spacing dilaudid 1mg  PO out to q6h prior to potential d/c - Protonix 40 mg PO daily - Last CIWA 2 - Senna, MiraLAX as needed for constipation - Folate, multivitamin, thiamine Urinary tract infection Intermittent back pain with no urinary sx. UA consistent with UTI.  S/p Rocephin in the ED. - Urine culture with multiple species present - Continue Rocephin 1g daily, last dose today - Diflucan 150 mg x 1 due to suspected yeast infection per patient   FEN/GI: regular  PPx: lovenox  Dispo:Home pending clinical improvement   Subjective:  No concerns overnight. Does note vaginal itching, hx of yeast infections while on antibiotics and this feels similar. Has had diflucan in the past. Feels better, able to tolerate broth and soup. Feels a bit distended still. Plans to call bariatric surgeon and PCP when she leaves.   Objective: Temp:  [98.6 F (37 C)-99.7 F (37.6 C)] 99 F (37.2 C) (11/20 0747) Pulse Rate:  [80-113] 87 (11/20 0747) Resp:  [17-18] 17 (11/20 0534) BP:  (111-132)/(67-81) 117/72 (11/20 0747) SpO2:  [96 %-100 %] 96 % (11/20 0747) Physical Exam: General: No acute distress, appears much more comfortable than the last few days Respiratory: No increased work of breathing on room air Abdomen: Soft, mildly distended, bowel sounds present, much less tender than on previous exam  Laboratory: Most recent CBC Lab Results  Component Value Date   WBC 11.0 (H) 04/23/2023   HGB 10.8 (L) 04/23/2023   HCT 32.5 (L) 04/23/2023   MCV 92.3 04/23/2023   PLT 256 04/23/2023   Most recent BMP    Latest Ref Rng & Units 04/23/2023    6:25 AM  BMP  Glucose 70 - 99 mg/dL 329   BUN 6 - 20 mg/dL <5   Creatinine 5.18 - 1.00 mg/dL 8.41   Sodium 660 - 630 mmol/L 134   Potassium 3.5 - 5.1 mmol/L 4.2   Chloride 98 - 111 mmol/L 102   CO2 22 - 32 mmol/L 25   Calcium 8.9 - 10.3 mg/dL 8.2    [name], DO 04/23/2023, 11:15 AM  PGY-1,  Family Medicine FPTS Intern pager: [phone], text pages welcome Secure chat group [facility] Teaching Service

## 2023-04-24 ENCOUNTER — Other Ambulatory Visit (HOSPITAL_COMMUNITY): Payer: Self-pay

## 2023-04-24 DIAGNOSIS — R109 Unspecified abdominal pain: Secondary | ICD-10-CM

## 2023-04-24 DIAGNOSIS — J189 Pneumonia, unspecified organism: Secondary | ICD-10-CM | POA: Insufficient documentation

## 2023-04-24 MED ORDER — AMOXICILLIN-POT CLAVULANATE 875-125 MG PO TABS
1.0000 | ORAL_TABLET | Freq: Two times a day (BID) | ORAL | 0 refills | Status: AC
  Filled 2023-04-24: qty 2, 1d supply, fill #0

## 2023-04-24 MED ORDER — AZITHROMYCIN 500 MG PO TABS
500.0000 mg | ORAL_TABLET | Freq: Every day | ORAL | 0 refills | Status: AC
  Filled 2023-04-24: qty 2, 2d supply, fill #0

## 2023-04-24 MED ORDER — PANTOPRAZOLE SODIUM 40 MG PO TBEC
40.0000 mg | DELAYED_RELEASE_TABLET | Freq: Every day | ORAL | 0 refills | Status: AC
  Filled 2023-04-24: qty 30, 30d supply, fill #0

## 2023-04-24 NOTE — TOC Transition Note (Signed)
Transition of Care [facility]) - CM/SW Discharge Note   Patient Details  Name: Alexandra Patrick MRN: [id number] Date of Birth: December 19, 1965  [facility] Care [facility]) CM/SW Contact:  Harriet Masson, RN Phone Number: 04/24/2023, 3:32 PM   Clinical Narrative:    Patient stable for discharge.  No TOC needs at this time.     Final next level of care: Home/Self Care Barriers to Discharge: Barriers Resolved   Patient Goals and CMS Choice  Return home    Discharge Placement                   home      Discharge Plan and Services Additional resources added to the After Visit Summary for   In-house Referral: Clinical Social Work                                   Social Determinants of Health (SDOH) Interventions SDOH Screenings   Food Insecurity: No Food Insecurity (04/21/2023)  Housing: Low Risk  (04/21/2023)  Transportation Needs: No Transportation Needs (04/21/2023)  Utilities: At Risk (04/21/2023)  Social Connections: Unknown (10/13/2021)   Received from [facility]  Tobacco Use: Medium Risk (06/28/2021)   Received from [facility], [facility]     Readmission Risk Interventions    04/24/2023    3:31 PM  Readmission Risk Prevention Plan  Post Dischage Appt Complete  Medication Screening Complete  Transportation Screening Complete

## 2023-04-24 NOTE — Progress Notes (Signed)
     Daily Progress Note Intern Pager: [phone]  Patient name: Alexandra Patrick Medical record number: [id number] Date of birth: June 23, 1965 Age: 57 y.o. Gender: female  Primary Care Provider: [facility] Consultants: none  Code Status: Full   Pt Overview and Major Events to Date:  04/21/23 - admitted   Assessment and Plan:  57 y.o. female with PMHx DM, GERD, gastric bypass presenting with abdominal pain, nausea, and vomiting. Overall abdominal pain symptoms have improved with overall unremarkable workup.   Developed cough, mild SOB, and fever reported by RN of 100.6 last night, CXR obtained with left basilar PNA. Started on azithromycin and extended course of Rocephin. Pt appears well today and would like to go home. D/c with PO azithromycin and augmentin. F/u closely with PCP and bariatric surgeon. Assessment & Plan Abdominal pain Workup overall unremarkable. Consulted GI who reached out via Secure Chat (Dr. Barron Alvine) and they suspect infectious enteritis with reactive ascites and edema based off workup, recommended supportive care. WBC slightly increased to 11, total bili 1.3 this morning. Liver enzymes normalized.  - Tylenol 650mg  q6h for mild to moderate pain - Spacing dilaudid 1mg  PO out to q6h prior to potential d/c - Protonix 40 mg PO daily - Last CIWA 0 - Senna, MiraLAX as needed for constipation - Folate, multivitamin, thiamine Pneumonia Began experiencing cough, shortness of breath, and fever overnight, along with persistent chest pain she has had intermittently since arrival. CXR obtained showing left basilar PNA. Treated for CAP with Rocephin and Zithromax.  Urinary tract infection Intermittent back pain with no urinary sx. UA consistent with UTI.  S/p Rocephin in the ED. - Urine culture with multiple species present - Continue Rocephin 1g daily, last dose today - Diflucan 150 mg x 1 due to suspected yeast infection per  patient   FEN/GI: regular PPx: lovenox Dispo:Home pending clinical improvement   Subjective:  Developed cough/SOB last night with fever, diagnosed with PNA on CXR. Abdominal pain has improved. Not requiring as many pain meds. Ready to go home.   Objective: Temp:  [97.8 F (36.6 C)-100.3 F (37.9 C)] 98 F (36.7 C) (11/21 0746) Pulse Rate:  [75-101] 87 (11/21 0746) Resp:  [18] 18 (11/21 0746) BP: (114-124)/(67-78) 120/78 (11/21 0746) SpO2:  [97 %-100 %] 100 % (11/21 0746) Physical Exam: General: well appearing, no acute distress Cardiovascular: RRR, no murmurs Respiratory: CTAB, no increased WOB on RA Abdomen: Soft, non-distended, non-tender this morning, much improved  Laboratory: Most recent CBC Lab Results  Component Value Date   WBC 11.0 (H) 04/23/2023   HGB 10.8 (L) 04/23/2023   HCT 32.5 (L) 04/23/2023   MCV 92.3 04/23/2023   PLT 256 04/23/2023   Most recent BMP    Latest Ref Rng & Units 04/23/2023    6:25 AM  BMP  Glucose 70 - 99 mg/dL 706   BUN 6 - 20 mg/dL <5   Creatinine 2.37 - 1.00 mg/dL 6.28   Sodium 315 - 176 mmol/L 134   Potassium 3.5 - 5.1 mmol/L 4.2   Chloride 98 - 111 mmol/L 102   CO2 22 - 32 mmol/L 25   Calcium 8.9 - 10.3 mg/dL 8.2     Imaging/Diagnostic Tests: CXR 04/23/23 IMPRESSION: Left basilar pneumonia.  Para March, DO 04/24/2023, 12:07 PM  PGY-1, [facility] Family Medicine FPTS Intern pager: [phone], text pages welcome Secure chat group [facility] Teaching Service

## 2023-04-24 NOTE — Progress Notes (Signed)
   04/24/23 1357  Mobility  Activity Ambulated independently in hallway  Level of Assistance Independent  Assistive Device None  Distance Ambulated (ft) 1000 ft  Activity Response Tolerated fair  Mobility Referral Yes  $Mobility charge 1 Mobility  Mobility Specialist Start Time (ACUTE ONLY) 1342  Mobility Specialist Stop Time (ACUTE ONLY) 1357  Mobility Specialist Time Calculation (min) (ACUTE ONLY) 15 min   Mobility Specialist: Progress Note  During Mobility: SpO2 95-96% RA  Pt agreeable to mobility session - received in bed. Ambulated ind with no AD. C/o being winded with exertion. Returned to standing in room with all needs met - call bell within reach.   Barnie Mort, BS Mobility Specialist Please contact via SecureChat or Rehab office at [phone].

## 2023-04-24 NOTE — Assessment & Plan Note (Signed)
Began experiencing cough, shortness of breath, and fever overnight, along with persistent chest pain she has had intermittently since arrival. CXR obtained showing left basilar PNA. Treated for CAP with Rocephin and Zithromax.

## 2023-04-24 NOTE — Assessment & Plan Note (Addendum)
Intermittent back pain with no urinary sx. UA consistent with UTI.  S/p Rocephin in the ED. - Urine culture with multiple species present - Continue Rocephin 1g daily, last dose today - Diflucan 150 mg x 1 due to suspected yeast infection per patient

## 2023-04-24 NOTE — TOC CM/SW Note (Addendum)
Transition of Care [facility]) - Inpatient Brief Assessment   Patient Details  Name: Alexandra Patrick MRN: [id number] Date of Birth: [birth year]  [facility]) CM/SW Contact:    Harriet Masson, RN Phone Number: 04/24/2023, 1:08 PM   Clinical Narrative:  Patient admitted for abdominal pain.  Patient receiving iv antibiotics.  CSW addressed SDOH needs. No other TOC needs at this time.  Transition of Care Asessment: Insurance and Status: Insurance coverage has been reviewed Patient has primary care physician: Yes Home environment has been reviewed: safe to discharge home when medically stable Prior level of function:: independent Prior/Current Home Services: No current home services  Readmission risk has been reviewed: Yes Transition of care needs: no transition of care needs at this time

## 2023-04-24 NOTE — Assessment & Plan Note (Addendum)
Workup overall unremarkable. Consulted GI who reached out via Secure Chat (Dr. Barron Alvine) and they suspect infectious enteritis with reactive ascites and edema based off workup, recommended supportive care. WBC slightly increased to 11, total bili 1.3 this morning. Liver enzymes normalized.  - Tylenol 650mg  q6h for mild to moderate pain - Spacing dilaudid 1mg  PO out to q6h prior to potential d/c - Protonix 40 mg PO daily - Last CIWA 0 - Senna, MiraLAX as needed for constipation - Folate, multivitamin, thiamine

## 2023-04-24 NOTE — Discharge Summary (Addendum)
Family Medicine Teaching [facility] Discharge Summary  Patient name: Alexandra Patrick Medical record number: [id number] Date of birth: [birth year] Age: 57 y.o. Gender: female Date of Admission: 04/21/2023  Date of Discharge: 04/24/23 Admitting Physician: Alexandra March, DO  Primary Care Provider: [facility]: GI  Indication for Hospitalization: abdominal pain  Discharge Diagnoses/Problem List:  Principal Problem for Admission: abdominal pain Other Problems addressed during stay:  Principal Problem:   Abdominal pain Active Problems:   Dehydration   Nausea and vomiting   Enteritis   Urinary tract infection   Pneumonia    Brief Hospital Course:  Alexandra Patrick is a 57 y.o.female with a history of T2DM, GERD, Roux-en-Y gastric bypass who was admitted to the family medicine teaching Service at [facility] for abdominal pain. Her hospital course is detailed below:  Generalized abdominal pain Generalized abdominal pain that started in her chest suddenly with associated N/V.  Troponin is negative. EKG unremarkable.  Mild leukocytosis.  Mildly elevated liver enzymes.  Ammonia negative.    Pregnancy test negative. ETOH less than 10.  CT abdomen pelvis with contrast with moderate small bowel wall thickening, mild ascites, minimal bibasilar atelectasis with small left pleural effusion.  GI curb sided and thought likely gastroenteritis versus severe GERD.  RUQ ultrasound showed ascites but negative for cholelithiasis or acute cholecystitis.  Started PPI and pain management. Pain improved by discharge.   UTI with hematuria Reports recent back pain which she associates with her UTIs in the past.  Some suspicion for nephrolithiasis given hematuria and back pain.  No dysuria or polyuria.  UA consistent with UTI.  Urine culture with multiple species.  Completed course of rocephin. Received diflucan 150mg  x1 for potential yeast infection.    Hypokalemia Initially hypokalemic secondary to GI losses.  Repleted and normalized by discharge.  Pneumonia Developed cough, SOB, fever on 11/20. CXR with left basilar PNA. Continued Rocephin course and started azithromycin. Discharged in stable condition with PO azithromycin and augmentin.    PCP Follow-up Recommendations: Ascites on RUQ [place] Pt plans to stop drinking wine when she leaves, f/u on this Started PPI in hospital, re-evaluate Ensure f/u with bariatric surgeon F/u pneumonia sx   Disposition: home  Discharge Condition: stable  Discharge Exam:  Vitals:   04/24/23 0300 04/24/23 0746  BP: 123/75 120/78  Pulse: 75 87  Resp: 18 18  Temp: 97.8 F (36.6 C) 98 F (36.7 C)  SpO2: 97% 100%   Physical Exam: General: well appearing, no acute distress Cardiovascular: RRR, no murmurs Respiratory: CTAB, no increased WOB on RA Abdomen: Soft, non-distended, non-tender this morning, much improved  Significant Procedures: none  Significant Labs and Imaging:  No results for input(s): "WBC", "HGB", "HCT", "PLT" in the last 48 hours.  No results for input(s): "NA", "K", "CL", "CO2", "GLUCOSE", "BUN", "CREATININE", "CALCIUM", "MG", "PHOS", "ALKPHOS", "AST", "ALT", "ALBUMIN", "PROTEIN" in the last 48 hours.  CT A/P 04/21/23 IMPRESSION: Moderate small bowel wall thickening is noted in the left upper and right lower quadrants most consistent with enteritis. Mild ascites is noted.  Status post gastric bypass.  Minimal bibasilar subsegmental atelectasis is noted with small left pleural effusion.  [place] Abd RUQ 04/21/23 IMPRESSION: 1. No cholelithiasis or sonographic evidence for acute cholecystitis. 2. Ascites.  CXR 04/23/23 IMPRESSION: Left basilar pneumonia.  Results/Tests Pending at Time of Discharge: none  Discharge Medications:  Allergies as of 04/24/2023   No Known Allergies      Medication List  TAKE these medications    amoxicillin-clavulanate  875-125 MG tablet Commonly known as: AUGMENTIN Take 1 tablet by mouth 2 (two) times daily.   azithromycin 500 MG tablet Commonly known as: Zithromax Take 1 tablet (500 mg total) by mouth daily.   CALCIUM PO Take 1 tablet by mouth daily.   multivitamin with minerals tablet Take 1 tablet by mouth daily.   pantoprazole 40 MG tablet Commonly known as: PROTONIX Take 1 tablet (40 mg total) by mouth daily.   VITAMIN D PO Take 1 tablet by mouth daily.        Discharge Instructions: Please refer to Patient Instructions section of EMR for full details.  Patient was counseled important signs and symptoms that should prompt return to medical care, changes in medications, dietary instructions, activity restrictions, and follow up appointments.   Follow-Up Appointments:  Follow-up Information     [facility] Schedule an appointment as soon as possible for a visit in 1 week(s).   Specialty: Family Medicine Contact information: [address] [place] [address] [phone]                 Alexandra Patrick PGY-1, [facility]  I reviewed the medical decision making and verified the service and findings are accurately documented in the resident's note.  Alexandra Alley, DO 04/25/2023 7:50 AM

## 2023-05-04 DIAGNOSIS — Z419 Encounter for procedure for purposes other than remedying health state, unspecified: Secondary | ICD-10-CM | POA: Diagnosis not present

## 2023-05-09 DIAGNOSIS — R232 Flushing: Secondary | ICD-10-CM | POA: Diagnosis not present

## 2023-05-09 DIAGNOSIS — R109 Unspecified abdominal pain: Secondary | ICD-10-CM | POA: Diagnosis not present

## 2023-05-09 DIAGNOSIS — M8580 Other specified disorders of bone density and structure, unspecified site: Secondary | ICD-10-CM | POA: Diagnosis not present

## 2023-05-09 DIAGNOSIS — E559 Vitamin D deficiency, unspecified: Secondary | ICD-10-CM | POA: Diagnosis not present

## 2023-05-09 DIAGNOSIS — E785 Hyperlipidemia, unspecified: Secondary | ICD-10-CM | POA: Diagnosis not present

## 2023-05-15 DIAGNOSIS — Z1211 Encounter for screening for malignant neoplasm of colon: Secondary | ICD-10-CM | POA: Diagnosis not present

## 2023-05-15 DIAGNOSIS — R1013 Epigastric pain: Secondary | ICD-10-CM | POA: Diagnosis not present

## 2023-05-15 DIAGNOSIS — R140 Abdominal distension (gaseous): Secondary | ICD-10-CM | POA: Diagnosis not present

## 2023-06-04 DIAGNOSIS — Z419 Encounter for procedure for purposes other than remedying health state, unspecified: Secondary | ICD-10-CM | POA: Diagnosis not present

## 2023-07-05 DIAGNOSIS — Z419 Encounter for procedure for purposes other than remedying health state, unspecified: Secondary | ICD-10-CM | POA: Diagnosis not present

## 2023-08-02 DIAGNOSIS — Z419 Encounter for procedure for purposes other than remedying health state, unspecified: Secondary | ICD-10-CM | POA: Diagnosis not present

## 2023-09-13 DIAGNOSIS — Z419 Encounter for procedure for purposes other than remedying health state, unspecified: Secondary | ICD-10-CM | POA: Diagnosis not present

## 2023-10-13 DIAGNOSIS — Z419 Encounter for procedure for purposes other than remedying health state, unspecified: Secondary | ICD-10-CM | POA: Diagnosis not present

## 2023-11-13 DIAGNOSIS — Z419 Encounter for procedure for purposes other than remedying health state, unspecified: Secondary | ICD-10-CM | POA: Diagnosis not present

## 2023-12-13 DIAGNOSIS — Z419 Encounter for procedure for purposes other than remedying health state, unspecified: Secondary | ICD-10-CM | POA: Diagnosis not present

## 2024-01-13 DIAGNOSIS — Z419 Encounter for procedure for purposes other than remedying health state, unspecified: Secondary | ICD-10-CM | POA: Diagnosis not present

## 2024-02-13 DIAGNOSIS — Z419 Encounter for procedure for purposes other than remedying health state, unspecified: Secondary | ICD-10-CM | POA: Diagnosis not present

## 2024-04-14 DIAGNOSIS — Z419 Encounter for procedure for purposes other than remedying health state, unspecified: Secondary | ICD-10-CM | POA: Diagnosis not present

## 2024-05-13 DIAGNOSIS — G43909 Migraine, unspecified, not intractable, without status migrainosus: Secondary | ICD-10-CM | POA: Diagnosis not present

## 2024-05-13 DIAGNOSIS — R9431 Abnormal electrocardiogram [ECG] [EKG]: Secondary | ICD-10-CM | POA: Diagnosis not present

## 2024-05-13 DIAGNOSIS — Z87891 Personal history of nicotine dependence: Secondary | ICD-10-CM | POA: Diagnosis not present

## 2024-05-13 DIAGNOSIS — R519 Headache, unspecified: Secondary | ICD-10-CM | POA: Diagnosis not present
